# Patient Record
Sex: Female | Born: 1990 | Race: White | Hispanic: No | Marital: Married | Smoking: Former smoker
Health system: Southern US, Community
[De-identification: ages and names within clinical notes are randomized; demographics above are authoritative.]

## PROBLEM LIST (undated history)

## (undated) DIAGNOSIS — N390 Urinary tract infection, site not specified: Secondary | ICD-10-CM

## (undated) DIAGNOSIS — R519 Headache, unspecified: Secondary | ICD-10-CM

## (undated) DIAGNOSIS — IMO0002 Reserved for concepts with insufficient information to code with codable children: Secondary | ICD-10-CM

## (undated) DIAGNOSIS — J45909 Unspecified asthma, uncomplicated: Secondary | ICD-10-CM

## (undated) DIAGNOSIS — F909 Attention-deficit hyperactivity disorder, unspecified type: Secondary | ICD-10-CM

## (undated) DIAGNOSIS — D649 Anemia, unspecified: Secondary | ICD-10-CM

## (undated) DIAGNOSIS — R87619 Unspecified abnormal cytological findings in specimens from cervix uteri: Secondary | ICD-10-CM

## (undated) DIAGNOSIS — J189 Pneumonia, unspecified organism: Secondary | ICD-10-CM

## (undated) DIAGNOSIS — K649 Unspecified hemorrhoids: Secondary | ICD-10-CM

## (undated) DIAGNOSIS — G8929 Other chronic pain: Secondary | ICD-10-CM

## (undated) DIAGNOSIS — R51 Headache: Secondary | ICD-10-CM

## (undated) DIAGNOSIS — Z1509 Genetic susceptibility to other malignant neoplasm: Secondary | ICD-10-CM

## (undated) HISTORY — DX: Genetic susceptibility to other malignant neoplasm: Z15.09

## (undated) HISTORY — DX: Urinary tract infection, site not specified: N39.0

## (undated) HISTORY — PX: OTHER SURGICAL HISTORY: SHX169

## (undated) HISTORY — DX: Unspecified hemorrhoids: K64.9

## (undated) HISTORY — DX: Other chronic pain: G89.29

## (undated) HISTORY — PX: HERNIA REPAIR: SHX51

## (undated) HISTORY — PX: TONSILLECTOMY: SUR1361

## (undated) HISTORY — DX: Headache: R51

## (undated) HISTORY — DX: Unspecified asthma, uncomplicated: J45.909

## (undated) HISTORY — DX: Reserved for concepts with insufficient information to code with codable children: IMO0002

## (undated) HISTORY — DX: Unspecified abnormal cytological findings in specimens from cervix uteri: R87.619

## (undated) HISTORY — DX: Pneumonia, unspecified organism: J18.9

## (undated) HISTORY — DX: Anemia, unspecified: D64.9

## (undated) HISTORY — DX: Headache, unspecified: R51.9

---
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
## 2012-03-12 ENCOUNTER — Other Ambulatory Visit: Payer: Self-pay

## 2012-12-01 ENCOUNTER — Other Ambulatory Visit: Payer: Self-pay

## 2012-12-08 ENCOUNTER — Other Ambulatory Visit: Payer: 59

## 2012-12-08 DIAGNOSIS — N912 Amenorrhea, unspecified: Secondary | ICD-10-CM

## 2012-12-09 LAB — HCG, QUANTITATIVE, PREGNANCY: hCG, Beta Chain, Quant, S: <2 m[IU]/mL

## 2013-01-03 ENCOUNTER — Ambulatory Visit (INDEPENDENT_AMBULATORY_CARE_PROVIDER_SITE_OTHER): Payer: 59 | Admitting: Emergency Medicine

## 2013-01-03 ENCOUNTER — Encounter: Payer: Self-pay | Admitting: Emergency Medicine

## 2013-01-03 VITALS — BP 104/62 | HR 70 | Temp 97.8°F | Resp 18 | Ht 64.5 in | Wt 116.0 lb

## 2013-01-03 DIAGNOSIS — R21 Rash and other nonspecific skin eruption: Secondary | ICD-10-CM

## 2013-01-03 DIAGNOSIS — N912 Amenorrhea, unspecified: Secondary | ICD-10-CM

## 2013-01-03 DIAGNOSIS — F4323 Adjustment disorder with mixed anxiety and depressed mood: Secondary | ICD-10-CM

## 2013-01-03 DIAGNOSIS — Z23 Encounter for immunization: Secondary | ICD-10-CM

## 2013-01-03 DIAGNOSIS — F411 Generalized anxiety disorder: Secondary | ICD-10-CM

## 2013-01-03 MED ORDER — SERTRALINE HCL 100 MG PO TABS: 100.0000 mg | ORAL_TABLET | Freq: Every day | ORAL | Status: DC

## 2013-01-03 MED ORDER — PREDNISONE 10 MG PO TABS: ORAL_TABLET | ORAL | Status: DC

## 2013-01-03 MED ORDER — DEXAMETHASONE SODIUM PHOSPHATE 100 MG/10ML IJ SOLN
10.0000 mg | Freq: Once | INTRAMUSCULAR | Status: AC
  Administered 2013-01-03: 10 mg via INTRAMUSCULAR

## 2013-01-03 NOTE — Patient Instructions (Signed)
Rash  A rash is a change in the color or feel of your skin. There are many different types of rashes. You may have other problems along with your rash.  HOME CARE  · Avoid the thing that caused your rash.  · Do not scratch your rash.  · You may take cools baths to help stop itching.  · Only take medicines as told by your doctor.  · Keep all doctor visits as told.  GET HELP RIGHT AWAY IF:   · Your pain, puffiness (swelling), or redness gets worse.  · You have a fever.  · You have new or severe problems.  · You have body aches, watery poop (diarrhea), or you throw up (vomit).  · Your rash is not better after 3 days.  MAKE SURE YOU:   · Understand these instructions.  · Will watch your condition.  · Will get help right away if you are not doing well or get worse.  Document Released: 07/01/2007 Document Revised: 04/06/2011 Document Reviewed: 10/27/2010  [employer]® Patient Information ©2014 [employer].

## 2013-01-04 DIAGNOSIS — F4323 Adjustment disorder with mixed anxiety and depressed mood: Secondary | ICD-10-CM | POA: Insufficient documentation

## 2013-01-04 NOTE — Progress Notes (Signed)
   Subjective:    Patient ID: Melissa Cantu, female    DOB: 05/09/1990, 22 y.o.   MRN: [id number]  HPI Comments: 22 yo without known new exposure or bug bites presents with increasing itchy rash. No relief with OTC topicals and Xyzal. She notes her home is not very clean and plans on relocating. She is also having increased stress and counselor advised increasing Zoloft dose. She is not eating as healthy or taking good care of herself and notes mild bruising were she has been scratching rash.  Rash    Current Outpatient Prescriptions on File Prior to Visit  Medication Sig Dispense Refill  . flintstones complete (FLINTSTONES) 60 MG chewable tablet Chew 1 tablet by mouth daily.      Marland Kitchen levocetirizine (XYZAL) 5 MG tablet Take 5 mg by mouth every evening.      . lisdexamfetamine (VYVANSE) 40 MG capsule Take 40 mg by mouth daily.       No current facility-administered medications on file prior to visit.  ZOLOFT 75 mg  Review of patient's allergies indicates no known allergies.  Past Medical History  Diagnosis Date  . ADHD (attention deficit hyperactivity disorder)   . 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   . Asthma   . Anemia      Review of Systems  Skin: Positive for rash.  Psychiatric/Behavioral: The patient is nervous/anxious.     BP 104/62  Pulse 70  Temp(Src) 97.8 F (36.6 C) (Temporal)  Resp 18  Ht 5' 4.5" (1.638 m)  Wt 116 lb (52.617 kg)  BMI 19.61 kg/m2  LMP 12/04/2012     Objective:   Physical Exam  Nursing note and vitals reviewed. Constitutional: She is oriented to person, place, and time. She appears well-developed and well-nourished. No distress.  HENT:  Head: Normocephalic and atraumatic.  Right Ear: External ear normal.  Left Ear: External ear normal.  Nose: Nose normal.  Mouth/Throat: Oropharynx is clear and moist.  Eyes: Conjunctivae and EOM are normal.  Neck: Normal range of motion. Neck  supple. No JVD present. No thyromegaly present.  Cardiovascular: Normal rate, regular rhythm, normal heart sounds and intact distal pulses.   Pulmonary/Chest: Effort normal and breath sounds normal.  Abdominal: Soft. Bowel sounds are normal. She exhibits no distension and no mass. There is no tenderness. There is no rebound and no guarding.  Musculoskeletal: Normal range of motion. She exhibits no edema and no tenderness.  Lymphadenopathy:    She has no cervical adenopathy.  Neurological: She is alert and oriented to person, place, and time. No cranial nerve deficit.  Skin: Skin is warm and dry. Rash noted. There is erythema. No pallor.  Scattered patches with mild elevation from 3-10 mm in size worse on upper extremities, few on back abdomen  Psychiatric: She has a normal mood and affect. Her behavior is normal. Judgment and thought content normal.      Urine preg NEG    Assessment & Plan:  1. Rash ? allergic reaction vs bug bites- Dexamethasone 1, if no relief Pred 10 mg DP AD. Clean house, hygiene explained, increase H20 may add benadryl. 2. Stress/ Anxiety increase- Increase Zoloft to 100mg , decrease stress 3. ? Bruising from trauma will monitor and call if no change for lab eval

## 2013-02-27 ENCOUNTER — Other Ambulatory Visit: Payer: Self-pay | Admitting: *Deleted

## 2013-02-27 DIAGNOSIS — F411 Generalized anxiety disorder: Secondary | ICD-10-CM

## 2013-02-27 MED ORDER — SERTRALINE HCL 100 MG PO TABS: 100.0000 mg | ORAL_TABLET | Freq: Every day | ORAL | Status: DC

## 2013-03-06 ENCOUNTER — Other Ambulatory Visit: Payer: Self-pay | Admitting: Emergency Medicine

## 2013-03-06 MED ORDER — LISDEXAMFETAMINE DIMESYLATE 40 MG PO CAPS: 40.0000 mg | ORAL_CAPSULE | Freq: Every day | ORAL | Status: DC

## 2013-04-13 ENCOUNTER — Encounter: Payer: Self-pay | Admitting: Emergency Medicine

## 2013-04-13 ENCOUNTER — Ambulatory Visit (INDEPENDENT_AMBULATORY_CARE_PROVIDER_SITE_OTHER): Payer: 59 | Admitting: Emergency Medicine

## 2013-04-13 VITALS — BP 98/62 | HR 72 | Temp 98.4°F | Resp 18 | Ht 64.5 in | Wt 117.0 lb

## 2013-04-13 DIAGNOSIS — N912 Amenorrhea, unspecified: Secondary | ICD-10-CM

## 2013-04-13 DIAGNOSIS — I10 Essential (primary) hypertension: Secondary | ICD-10-CM

## 2013-04-13 DIAGNOSIS — N926 Irregular menstruation, unspecified: Secondary | ICD-10-CM

## 2013-04-13 LAB — BASIC METABOLIC PANEL WITH GFR
BUN: 8 mg/dL (ref 6–23)
CALCIUM: 9.4 mg/dL (ref 8.4–10.5)
CHLORIDE: 109 meq/L (ref 96–112)
CO2: 26 meq/L (ref 19–32)
Creat: 0.64 mg/dL (ref 0.50–1.10)
GFR, Est African American: >89 mL/min
Glucose, Bld: 82 mg/dL (ref 70–99)
Potassium: 4 mEq/L (ref 3.5–5.3)
SODIUM: 141 meq/L (ref 135–145)

## 2013-04-13 LAB — POCT URINE PREGNANCY: PREG TEST UR: NEGATIVE

## 2013-04-13 LAB — CBC WITH DIFFERENTIAL/PLATELET
Basophils Absolute: 0.1 10*3/uL (ref 0.0–0.1)
Basophils Relative: 1 % (ref 0–1)
EOS PCT: 2 % (ref 0–5)
Eosinophils Absolute: 0.2 10*3/uL (ref 0.0–0.7)
HCT: 37.2 % (ref 36.0–46.0)
Hemoglobin: 12.5 g/dL (ref 12.0–15.0)
LYMPHS ABS: 2.4 10*3/uL (ref 0.7–4.0)
LYMPHS PCT: 28 % (ref 12–46)
MCH: 29.4 pg (ref 26.0–34.0)
MCHC: 33.6 g/dL (ref 30.0–36.0)
MCV: 87.5 fL (ref 78.0–100.0)
Monocytes Absolute: 0.9 10*3/uL (ref 0.1–1.0)
Monocytes Relative: 11 % (ref 3–12)
NEUTROS ABS: 4.9 10*3/uL (ref 1.7–7.7)
Neutrophils Relative %: 58 % (ref 43–77)
PLATELETS: 267 10*3/uL (ref 150–400)
RBC: 4.25 MIL/uL (ref 3.87–5.11)
RDW: 13.9 % (ref 11.5–15.5)
WBC: 8.5 10*3/uL (ref 4.0–10.5)

## 2013-04-13 LAB — TSH: TSH: 1.553 u[IU]/mL (ref 0.350–4.500)

## 2013-04-13 NOTE — Patient Instructions (Signed)
Ovarian Cyst An ovarian cyst is a sac filled with fluid or blood. This sac is attached to the ovary. Some cysts go away on their own. Other cysts need treatment.  HOME CARE   Only take medicine as told by your doctor.  Follow up with your doctor as told.  Get regular pelvic exams and Pap tests. GET HELP IF:  Your periods are late, not regular, or painful.  You stop having periods.  Your belly (abdominal) or pelvic pain does not go away.  Your belly becomes large or puffy (swollen).  You have a hard time peeing (totally emptying your bladder).  You have pressure on your bladder.  You have pain during sex.  You feel fullness, pressure, or discomfort in your belly.  You lose weight for no reason.  You feel sick most of the time.  You have a hard time pooping (constipation).  You do not feel like eating.  You develop pimples (acne).  You have an increase in hair on your body and face.  You are gaining weight for no reason.  You think you are pregnant. GET HELP RIGHT AWAY IF:   Your belly pain gets worse.  You feel sick to your stomach (nauseous), and you throw up (vomit).  You have a fever that comes on fast.  You have belly pain while pooping (bowel movement).  Your periods are heavier than usual. MAKE SURE YOU:   Understand these instructions.  Will watch your condition.  Will get help right away if you are not doing well or get worse. Document Released: 07/01/2007 Document Revised: 11/02/2012 Document Reviewed: 09/19/2012 [facility] Patient Information 2014 [place]. Ectopic Pregnancy An ectopic pregnancy happens when a fertilized egg grows outside the uterus. A pregnancy cannot live outside of the uterus. This problem often happens in the fallopian tube. It is often caused by damage to the fallopian tube. If this problem is found early, you may be treated with medicine. If your tube tears or bursts open (ruptures), you will bleed inside. This is an  emergency. You will need surgery. Get help right away.  SYMPTOMS You may have normal pregnancy symptoms at first. These include:  Missing your period.  Feeling sick to your stomach (nauseous).  Being tired.  Having tender breasts. Then, you may start to have symptoms that are not normal. These include:  Pain with sex (intercourse).  Bleeding from the vagina. This includes light bleeding (spotting).  Belly (abdomen) or lower belly cramping or pain. This may be felt on one side.  A fast heartbeat (pulse).  Passing out (fainting) after going poop (bowel movement). If your tube tears, you may have symptoms such as:  Really bad pain in the belly or lower belly. This happens suddenly.  Dizziness.  Passing out.  Shoulder pain. GET HELP RIGHT AWAY IF:  You have any of these symptoms. This is an emergency. Document Released: 04/10/2008 Document Revised: 11/02/2012 Document Reviewed: 08/24/2012 [facility] Patient Information 2014 [place], [place].

## 2013-04-13 NOTE — Progress Notes (Signed)
   Subjective:    Patient ID: Melissa Cantu, female    DOB: 1990-02-23, 23 y.o.   MRN: [id number]  HPI Comments: 23 yo female with hx of erratic menses.She has been doing better off the pill x several pill for last several months. She started cycle 4 days early and heavy/ clotting/ painful across low abdomen, but mildly worse on the left. She is feeling more fatigued over the last week or so. She has had unprotected sex and is unsure if she is pregnant.     Medication List       This list is accurate as of: 04/13/13 11:59 PM.  Always use your most recent med list.               flintstones complete 60 MG chewable tablet  Chew 1 tablet by mouth daily.     levocetirizine 5 MG tablet  Commonly known as:  XYZAL  Take 5 mg by mouth every evening.     lisdexamfetamine 40 MG capsule  Commonly known as:  VYVANSE  Take 1 capsule (40 mg total) by mouth daily.     sertraline 100 MG tablet  Commonly known as:  ZOLOFT  Take 1 tablet (100 mg total) by mouth daily.       No Known Allergies Past Medical History  Diagnosis Date  . ADHD (attention deficit hyperactivity disorder)   . 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   . Asthma   . Anemia      Review of Systems  Constitutional: Positive for fatigue.  Genitourinary: Positive for menstrual problem and pelvic pain.  All other systems reviewed and are negative.   BP 98/62  Pulse 72  Temp(Src) 98.4 F (36.9 C) (Temporal)  Resp 18  Ht 5' 4.5" (1.638 m)  Wt 117 lb (53.071 kg)  BMI 19.78 kg/m2  LMP 04/13/2013     Objective:   Physical Exam  Nursing note and vitals reviewed. Constitutional: She is oriented to person, place, and time. She appears well-developed and well-nourished.  HENT:  Head: Normocephalic and atraumatic.  Eyes: Conjunctivae are normal.  Neck: Normal range of motion.  Cardiovascular: Normal rate, regular rhythm, normal heart sounds and intact  distal pulses.   Pulmonary/Chest: Effort normal and breath sounds normal.  Abdominal: Soft. Bowel sounds are normal. She exhibits no distension and no mass. There is no tenderness. There is no rebound and no guarding.  Genitourinary:  DEF to GYN  Musculoskeletal: Normal range of motion.  Neurological: She is alert and oriented to person, place, and time.  Skin: Skin is warm and dry.  Psychiatric: She has a normal mood and affect. Judgment normal.      Urine Preg NEG    Assessment & Plan:  Irreg menses- Check labs, U/s Pelvic if labs negative, If negative needs GYN, w/c if SX increase or ER.

## 2013-04-14 ENCOUNTER — Ambulatory Visit
Admission: RE | Admit: 2013-04-14 | Discharge: 2013-04-14 | Disposition: A | Discharge status: Home / Self Care | Payer: 59 | Source: Ambulatory Visit | Attending: Emergency Medicine | Admitting: Emergency Medicine

## 2013-04-14 DIAGNOSIS — N926 Irregular menstruation, unspecified: Secondary | ICD-10-CM

## 2013-04-14 LAB — HCG, QUANTITATIVE, PREGNANCY

## 2013-04-21 ENCOUNTER — Ambulatory Visit (HOSPITAL_COMMUNITY): Payer: 59

## 2013-06-09 ENCOUNTER — Telehealth: Payer: Self-pay

## 2013-06-09 NOTE — Telephone Encounter (Signed)
Pt states she missed menstrual cycle last month, but did not take a pregnancy test. Started cycle yesterday.  this morning she is having severe cramping w/ clear discharge and "can feel her hips widening." Pt is concerned she may be having a miscarriage?  Per Vicie Mutters pt is advised to go to ER for evaluation. Pt aware.

## 2013-07-20 ENCOUNTER — Encounter: Payer: Self-pay | Admitting: Physician Assistant

## 2013-07-20 ENCOUNTER — Ambulatory Visit (INDEPENDENT_AMBULATORY_CARE_PROVIDER_SITE_OTHER): Payer: 59 | Admitting: Physician Assistant

## 2013-07-20 ENCOUNTER — Other Ambulatory Visit: Payer: Self-pay | Admitting: Emergency Medicine

## 2013-07-20 VITALS — BP 100/60 | HR 72 | Temp 98.6°F | Resp 16 | Ht 64.5 in | Wt 112.0 lb

## 2013-07-20 DIAGNOSIS — F411 Generalized anxiety disorder: Secondary | ICD-10-CM

## 2013-07-20 DIAGNOSIS — K2100 Gastro-esophageal reflux disease with esophagitis, without bleeding: Secondary | ICD-10-CM

## 2013-07-20 DIAGNOSIS — R079 Chest pain, unspecified: Secondary | ICD-10-CM

## 2013-07-20 DIAGNOSIS — F17200 Nicotine dependence, unspecified, uncomplicated: Secondary | ICD-10-CM

## 2013-07-20 MED ORDER — SERTRALINE HCL 100 MG PO TABS: 100.0000 mg | ORAL_TABLET | Freq: Every day | ORAL | Status: DC

## 2013-07-20 MED ORDER — PANTOPRAZOLE SODIUM 40 MG PO TBEC: 40.0000 mg | DELAYED_RELEASE_TABLET | Freq: Every day | ORAL | Status: DC

## 2013-07-20 NOTE — Patient Instructions (Signed)
Smoking Cessation Quitting smoking is important to your health and has many advantages. However, it is not always easy to quit since nicotine is a very addictive drug. Often times, people try 3 times or more before being able to quit. This document explains the best ways for you to prepare to quit smoking. Quitting takes hard work and a lot of effort, but you can do it. ADVANTAGES OF QUITTING SMOKING  You will live longer, feel better, and live better.  Your body will feel the impact of quitting smoking almost immediately.  Within 20 minutes, blood pressure decreases. Your pulse returns to its normal level.  After 8 hours, carbon monoxide levels in the blood return to normal. Your oxygen level increases.  After 24 hours, the chance of having a heart attack starts to decrease. Your breath, hair, and body stop smelling like smoke.  After 48 hours, damaged nerve endings begin to recover. Your sense of taste and smell improve.  After 72 hours, the body is virtually free of nicotine. Your bronchial tubes relax and breathing becomes easier.  After 2 to 12 weeks, lungs can hold more air. Exercise becomes easier and circulation improves.  The risk of having a heart attack, stroke, cancer, or lung disease is greatly reduced.  After 1 year, the risk of coronary heart disease is cut in half.  After 5 years, the risk of stroke falls to the same as a nonsmoker.  After 10 years, the risk of lung cancer is cut in half and the risk of other cancers decreases significantly.  After 15 years, the risk of coronary heart disease drops, usually to the level of a nonsmoker.  If you are pregnant, quitting smoking will improve your chances of having a healthy baby.  The people you live with, especially any children, will be healthier.  You will have extra money to spend on things other than cigarettes. QUESTIONS TO THINK ABOUT BEFORE ATTEMPTING TO QUIT You may want to talk about your answers with your  caregiver.  Why do you want to quit?  If you tried to quit in the past, what helped and what did not?  What will be the most difficult situations for you after you quit? How will you plan to handle them?  Who can help you through the tough times? Your family? Friends? A caregiver?  What pleasures do you get from smoking? What ways can you still get pleasure if you quit? Here are some questions to ask your caregiver:  How can you help me to be successful at quitting?  What medicine do you think would be best for me and how should I take it?  What should I do if I need more help?  What is smoking withdrawal like? How can I get information on withdrawal? GET READY  Set a quit date.  Change your environment by getting rid of all cigarettes, ashtrays, matches, and lighters in your home, car, or work. Do not let people smoke in your home.  Review your past attempts to quit. Think about what worked and what did not. GET SUPPORT AND ENCOURAGEMENT You have a better chance of being successful if you have help. You can get support in many ways.  Tell your family, friends, and co-workers that you are going to quit and need their support. Ask them not to smoke around you.  Get individual, group, or telephone counseling and support. Programs are available at local hospitals and health centers. Call your local health department for   information about programs in your area.  Spiritual beliefs and practices may help some smokers quit.  Download a "quit meter" on your computer to keep track of quit statistics, such as how long you have gone without smoking, cigarettes not smoked, and money saved.  Get a self-help book about quitting smoking and staying off of tobacco. [place] yourself from urges to smoke. Talk to someone, go for a walk, or occupy your time with a task.  Change your normal routine. Take a different route to work. Drink tea instead of coffee.  Eat breakfast in a different place.  Reduce your stress. Take a hot bath, exercise, or read a book.  Plan something enjoyable to do every day. Reward yourself for not smoking.  Explore interactive web-based programs that specialize in helping you quit. GET MEDICINE AND USE IT CORRECTLY Medicines can help you stop smoking and decrease the urge to smoke. Combining medicine with the above behavioral methods and support can greatly increase your chances of successfully quitting smoking.  Nicotine replacement therapy helps deliver nicotine to your body without the negative effects and risks of smoking. Nicotine replacement therapy includes nicotine gum, lozenges, inhalers, nasal sprays, and skin patches. Some may be available over-the-counter and others require a prescription.  Antidepressant medicine helps people abstain from smoking, but how this works is unknown. This medicine is available by prescription.  Nicotinic receptor partial agonist medicine simulates the effect of nicotine in your brain. This medicine is available by prescription. Ask your caregiver for advice about which medicines to use and how to use them based on your health history. Your caregiver will tell you what side effects to look out for if you choose to be on a medicine or therapy. Carefully read the information on the package. Do not use any other product containing nicotine while using a nicotine replacement product.  RELAPSE OR DIFFICULT SITUATIONS Most relapses occur within the first 3 months after quitting. Do not be discouraged if you start smoking again. Remember, most people try several times before finally quitting. You may have symptoms of withdrawal because your body is used to nicotine. You may crave cigarettes, be irritable, feel very hungry, cough often, get headaches, or have difficulty concentrating. The withdrawal symptoms are only temporary. They are strongest when you first quit, but they will go away within  10-14 days. To reduce the chances of relapse, try to:  Avoid drinking alcohol. Drinking lowers your chances of successfully quitting.  Reduce the amount of caffeine you consume. Once you quit smoking, the amount of caffeine in your body increases and can give you symptoms, such as a rapid heartbeat, sweating, and anxiety.  Avoid smokers because they can make you want to smoke.  Do not let weight gain distract you. Many smokers will gain weight when they quit, usually less than 10 pounds. Eat a healthy diet and stay active. You can always lose the weight gained after you quit.  Find ways to improve your mood other than smoking. FOR MORE INFORMATION  www.smokefree.gov  Document Released: 01/06/2001 Document Revised: 07/14/2011 Document Reviewed: 04/23/2011 [facility] Patient Information 2015 [place], [place]. This information is not intended to replace advice given to you by your health care provider. Make sure you discuss any questions you have with your health care provider.  Food Choices for Gastroesophageal Reflux Disease When you have gastroesophageal reflux disease (GERD), the foods you eat and your eating habits are very important. Choosing the right foods can  help ease the discomfort of GERD. WHAT GENERAL GUIDELINES DO I NEED TO FOLLOW?  Choose fruits, vegetables, whole grains, low-fat dairy products, and low-fat meat, fish, and poultry.  Limit fats such as oils, salad dressings, butter, nuts, and avocado.  Keep a food diary to identify foods that cause symptoms.  Avoid foods that cause reflux. These may be different for different people.  Eat frequent small meals instead of three large meals each day.  Eat your meals slowly, in a relaxed setting.  Limit fried foods.  Cook foods using methods other than frying.  Avoid drinking alcohol.  Avoid drinking large amounts of liquids with your meals.  Avoid bending over or lying down until 2-3 hours after eating. WHAT FOODS ARE  NOT RECOMMENDED? The following are some foods and drinks that may worsen your symptoms: Vegetables Tomatoes. Tomato juice. Tomato and spaghetti sauce. Chili peppers. Onion and garlic. Horseradish. Fruits Oranges, grapefruit, and lemon (fruit and juice). Meats High-fat meats, fish, and poultry. This includes hot dogs, ribs, ham, sausage, salami, and bacon. Dairy Whole milk and chocolate milk. Sour cream. Cream. Butter. Ice cream. Cream cheese.  Beverages Coffee and tea, with or without caffeine. Carbonated beverages or energy drinks. Condiments Hot sauce. Barbecue sauce.  Sweets/Desserts Chocolate and cocoa. Donuts. Peppermint and spearmint. Fats and Oils High-fat foods, including [place] fries and potato chips. Other Vinegar. Strong spices, such as black pepper, white pepper, red pepper, cayenne, curry powder, cloves, ginger, and chili powder. The items listed above may not be a complete list of foods and beverages to avoid. Contact your dietitian for more information. Document Released: 01/12/2005 Document Revised: 01/17/2013 Document Reviewed: 11/16/2012 [facility] Patient Information 2015 [place], [place]. This information is not intended to replace advice given to you by your health care provider. Make sure you discuss any questions you have with your health care provider.

## 2013-07-20 NOTE — Progress Notes (Signed)
   Subjective:    Patient ID: Melissa Cantu, female    DOB: 1990/08/31, 23 y.o.   MRN: [id number]  HPI 23 y.o. smoking female with 2 day history of chest pain. States it is intermittent nonexertional chest burning and tightness, feels she can not get a deep breath, has some bad taste in her throat, some left very well localized chest pain, and lower back pain. Denies palpitations, diarrhea, nausea, constipations, dizziness.    Review of Systems  Constitutional: Negative.   HENT: Negative.   Respiratory: Positive for chest tightness and shortness of breath. Negative for cough, choking and wheezing.   Cardiovascular: Positive for chest pain. Negative for palpitations and leg swelling.  Gastrointestinal: Negative.   Genitourinary: Negative.   Musculoskeletal: Negative.   Neurological: Negative.        Objective:   Physical Exam  Constitutional: She is oriented to person, place, and time. She appears well-developed and well-nourished.  HENT:  Head: Normocephalic and atraumatic.  Right Ear: External ear normal.  Left Ear: External ear normal.  Mouth/Throat: Oropharynx is clear and moist.  Eyes: Conjunctivae and EOM are normal. Pupils are equal, round, and reactive to light.  Neck: Normal range of motion. Neck supple. No thyromegaly present.  Cardiovascular: Normal rate, regular rhythm and normal heart sounds.  Exam reveals no gallop and no friction rub.   No murmur heard. Pulmonary/Chest: Effort normal and breath sounds normal. No respiratory distress. She has no wheezes.  Abdominal: Soft. Bowel sounds are normal. She exhibits no shifting dullness, no distension, no abdominal bruit, no pulsatile midline mass and no mass. There is no hepatosplenomegaly. There is tenderness in the epigastric area and left lower quadrant. There is guarding. There is no rigidity, no rebound, no CVA tenderness, no tenderness at McBurney's point and negative Murphy's sign. No hernia.  Musculoskeletal: Normal  range of motion.  Lymphadenopathy:    She has no cervical adenopathy.  Neurological: She is alert and oriented to person, place, and time.  Skin: Skin is warm and dry.  Psychiatric: She has a normal mood and affect.      Assessment & Plan:  Epigastric.atypical chest pain versus pleuritis from smoking-   bland diet, small portions, increase H20, PPI Smoking cessation discussed if pain continues will return for CXR and labs.  Patient declines labs/Xray due to cost and would prefer conservative treatment first.   Patient advised to go to the ER if the symptoms increase or worsen.

## 2013-09-13 ENCOUNTER — Encounter: Payer: Self-pay | Admitting: Physician Assistant

## 2013-09-13 ENCOUNTER — Ambulatory Visit (INDEPENDENT_AMBULATORY_CARE_PROVIDER_SITE_OTHER): Payer: 59 | Admitting: Physician Assistant

## 2013-09-13 VITALS — BP 100/70 | HR 76 | Temp 98.1°F | Resp 16 | Wt 111.0 lb

## 2013-09-13 DIAGNOSIS — N760 Acute vaginitis: Secondary | ICD-10-CM

## 2013-09-13 DIAGNOSIS — R300 Dysuria: Secondary | ICD-10-CM

## 2013-09-13 DIAGNOSIS — N912 Amenorrhea, unspecified: Secondary | ICD-10-CM

## 2013-09-13 DIAGNOSIS — Z113 Encounter for screening for infections with a predominantly sexual mode of transmission: Secondary | ICD-10-CM

## 2013-09-13 MED ORDER — LISDEXAMFETAMINE DIMESYLATE 40 MG PO CAPS: 40.0000 mg | ORAL_CAPSULE | Freq: Every day | ORAL | Status: DC

## 2013-09-13 MED ORDER — METRONIDAZOLE 500 MG PO TABS: ORAL_TABLET | ORAL | Status: DC

## 2013-09-13 NOTE — Progress Notes (Signed)
Subjective:     Melissa Cantu is a 23 y.o. female who presents for evaluation of an abnormal vaginal discharge, she treated with OTC cream 2 weeks ago but it started again 2 days ago. Symptoms have been present for 2 days. Vaginal symptoms: abnormal bleeding: early menses, heavy bleeding for 1 week, discharge described as white, malodorous and yellow, dyspareunia, odor, urinary symptoms of chills, dysuria and nausea and vulvar itching. Contraception: none. Has unprotected sex with boyfriend. She denies blisters, bumps, lesions and warts Sexually transmitted infection risk: possible STD exposure. Menstrual flow: was early this month and lasted a week, she has a history of irregular menses.  The following portions of the patient's history were reviewed and updated as appropriate: allergies, current medications, past family history, past medical history, past social history, past surgical history and problem list.   Review of Systems Pertinent items are noted in HPI.    Objective:    BP 100/70  Pulse 76  Temp(Src) 98.1 F (36.7 C)  Resp 16  Wt 111 lb (50.349 kg) General appearance: alert Abdomen: normal findings: bowel sounds normal and no masses palpable and abnormal findings:  mild tenderness in the epigastrium, in the lower abdomen and without rebound tenderness Pelvic: positive findings: positive whiff test, vaginal discharge:  copious, yellow and malodorous or with straberry cervix    Assessment:    Bacterial vaginosis, Chlamydia, Gonorrhea, Trichomonas vaginalis and at risk for STDs/pregnancy.    Plan:    Educational materials distributed. Oral antifungal see orders. Abstinence from intercourse discussed. Partner notification discussed. Discussed safe sex.

## 2013-09-13 NOTE — Patient Instructions (Signed)

## 2013-09-14 LAB — URINALYSIS, MICROSCOPIC ONLY
CRYSTALS: NONE SEEN
Casts: NONE SEEN

## 2013-09-14 LAB — FLUORESCENT TREPONEMAL AB(FTA)-IGG-BLD: Fluorescent Treponemal ABS: NONREACTIVE

## 2013-09-14 LAB — URINALYSIS, ROUTINE W REFLEX MICROSCOPIC
BILIRUBIN URINE: NEGATIVE
GLUCOSE, UA: NEGATIVE mg/dL
HGB URINE DIPSTICK: NEGATIVE
KETONES UR: NEGATIVE mg/dL
Nitrite: POSITIVE — AB
PH: 5.5 (ref 5.0–8.0)
Protein, ur: NEGATIVE mg/dL
SPECIFIC GRAVITY, URINE: 1.028 (ref 1.005–1.030)
Urobilinogen, UA: 0.2 mg/dL (ref 0.0–1.0)

## 2013-09-14 LAB — WET PREP BY MOLECULAR PROBE
CANDIDA SPECIES: POSITIVE — AB
Gardnerella vaginalis: POSITIVE — AB
TRICHOMONAS VAG: NEGATIVE

## 2013-09-14 LAB — RPR TITER: RPR Titer: 1:1 {titer}

## 2013-09-14 LAB — RPR: RPR Ser Ql: REACTIVE — AB

## 2013-09-14 LAB — GC/CHLAMYDIA PROBE AMP, URINE
Chlamydia, Swab/Urine, PCR: NEGATIVE
GC PROBE AMP, URINE: NEGATIVE

## 2013-09-14 LAB — HIV ANTIBODY (ROUTINE TESTING W REFLEX): HIV 1&2 Ab, 4th Generation: NONREACTIVE

## 2013-09-14 LAB — HSV(HERPES SIMPLEX VRS) I + II AB-IGG: HSV 1 GLYCOPROTEIN G AB, IGG: 3.67 IV — AB

## 2013-09-14 MED ORDER — FLUCONAZOLE 150 MG PO TABS: 150.0000 mg | ORAL_TABLET | Freq: Every day | ORAL | Status: DC

## 2013-09-14 NOTE — Addendum Note (Signed)
Addended by: Vicie Mutters R on: 09/14/2013 08:25 AM   Modules accepted: Orders

## 2013-09-16 LAB — URINE CULTURE

## 2013-09-16 MED ORDER — NITROFURANTOIN MONOHYD MACRO 100 MG PO CAPS: 100.0000 mg | ORAL_CAPSULE | Freq: Two times a day (BID) | ORAL | Status: AC

## 2013-09-16 NOTE — Addendum Note (Signed)
Addended by: Vicie Mutters R on: 09/16/2013 12:17 PM   Modules accepted: Orders

## 2013-10-09 ENCOUNTER — Other Ambulatory Visit: Payer: Self-pay | Admitting: Emergency Medicine

## 2013-12-04 ENCOUNTER — Other Ambulatory Visit: Payer: Self-pay | Admitting: Physician Assistant

## 2013-12-04 MED ORDER — LISDEXAMFETAMINE DIMESYLATE 40 MG PO CAPS: 40.0000 mg | ORAL_CAPSULE | Freq: Every day | ORAL | Status: DC

## 2015-10-03 ENCOUNTER — Encounter: Payer: Self-pay | Admitting: Physician Assistant

## 2015-10-03 ENCOUNTER — Ambulatory Visit (INDEPENDENT_AMBULATORY_CARE_PROVIDER_SITE_OTHER): Payer: 59 | Admitting: Physician Assistant

## 2015-10-03 VITALS — BP 106/60 | HR 63 | Temp 97.3°F | Resp 16 | Ht 64.5 in | Wt 123.2 lb

## 2015-10-03 DIAGNOSIS — N3000 Acute cystitis without hematuria: Secondary | ICD-10-CM | POA: Diagnosis not present

## 2015-10-03 DIAGNOSIS — R1084 Generalized abdominal pain: Secondary | ICD-10-CM | POA: Diagnosis not present

## 2015-10-03 DIAGNOSIS — M545 Low back pain, unspecified: Secondary | ICD-10-CM

## 2015-10-03 LAB — COMPREHENSIVE METABOLIC PANEL
ALK PHOS: 45 U/L (ref 33–115)
ALT: 17 U/L (ref 6–29)
AST: 14 U/L (ref 10–30)
Albumin: 4.5 g/dL (ref 3.6–5.1)
BILIRUBIN TOTAL: 0.6 mg/dL (ref 0.2–1.2)
BUN: 13 mg/dL (ref 7–25)
CALCIUM: 9.6 mg/dL (ref 8.6–10.2)
CO2: 21 mmol/L (ref 20–31)
CREATININE: 0.67 mg/dL (ref 0.50–1.10)
Chloride: 110 mmol/L (ref 98–110)
GLUCOSE: 80 mg/dL (ref 65–99)
Potassium: 3.7 mmol/L (ref 3.5–5.3)
Sodium: 138 mmol/L (ref 135–146)
Total Protein: 7.3 g/dL (ref 6.1–8.1)

## 2015-10-03 LAB — CBC WITH DIFFERENTIAL/PLATELET
BASOS PCT: 1 %
Basophils Absolute: 86 cells/uL (ref 0–200)
Eosinophils Absolute: 258 cells/uL (ref 15–500)
Eosinophils Relative: 3 %
HEMATOCRIT: 38.4 % (ref 35.0–45.0)
HEMOGLOBIN: 12.8 g/dL (ref 11.7–15.5)
LYMPHS ABS: 3354 {cells}/uL (ref 850–3900)
LYMPHS PCT: 39 %
MCH: 29.2 pg (ref 27.0–33.0)
MCHC: 33.3 g/dL (ref 32.0–36.0)
MCV: 87.5 fL (ref 80.0–100.0)
MONO ABS: 430 {cells}/uL (ref 200–950)
MPV: 9.9 fL (ref 7.5–12.5)
Monocytes Relative: 5 %
Neutro Abs: 4472 cells/uL (ref 1500–7800)
Neutrophils Relative %: 52 %
Platelets: 302 10*3/uL (ref 140–400)
RBC: 4.39 MIL/uL (ref 3.80–5.10)
RDW: 13.4 % (ref 11.0–15.0)
WBC: 8.6 10*3/uL (ref 3.8–10.8)

## 2015-10-03 LAB — POCT URINE PREGNANCY: Preg Test, Ur: NEGATIVE

## 2015-10-03 MED ORDER — MELOXICAM 15 MG PO TABS: ORAL_TABLET | ORAL | 1 refills | Status: DC

## 2015-10-03 MED ORDER — CYCLOBENZAPRINE HCL 10 MG PO TABS: 10.0000 mg | ORAL_TABLET | Freq: Three times a day (TID) | ORAL | 0 refills | Status: DC | PRN

## 2015-10-03 NOTE — Patient Instructions (Signed)

## 2015-10-03 NOTE — Progress Notes (Signed)
   Subjective:    Patient ID: Melissa Cantu, female    DOB: [birth year], 25 y.o.   MRN: [id number]  HPI 25 y.o. WF presents with back pain  x 2 weeks, worse last few days.   LMP August 15th.   Bilateral lower back pain that will wrap around to her stomach, certain positions make it worse, worse with standing straight, some pain at night trying to find comfortable spot, driving long periods in the car. Has some right knee pain with pain up her leg, no weakness, numbness, tingling. Denies urgency, frequency, dysuria, no fever/chills but . She has had some diarrhea at night.   Blood pressure 106/60, pulse 63, temperature 97.3 F (36.3 C), resp. rate 16, height 5' 4.5" (1.638 m), weight 123 lb 3.2 oz (55.9 kg), last menstrual period 09/10/2015, SpO2 97 %.  Medications Current Outpatient Prescriptions on File Prior to Visit  Medication Sig  . levocetirizine (XYZAL) 5 MG tablet Take 5 mg by mouth every evening.  . lisdexamfetamine (VYVANSE) 40 MG capsule Take 1 capsule (40 mg total) by mouth daily.   No current facility-administered medications on file prior to visit.     Problem list She has LGSIL (low grade squamous intraepithelial dysplasia) and Adjustment disorder with mixed anxiety and depressed mood on her problem list.  Review of Systems  Constitutional: Negative for chills, fatigue and fever.  Gastrointestinal: Positive for abdominal pain (general). Negative for nausea and vomiting.  Genitourinary: Negative for difficulty urinating, dysuria, flank pain, frequency, hematuria and urgency.  Musculoskeletal: Positive for back pain.       Objective:   Physical Exam  Constitutional: She is oriented to person, place, and time. She appears well-developed and well-nourished.  HENT:  Head: Normocephalic and atraumatic.  Eyes: Conjunctivae are normal. Pupils are equal, round, and reactive to light.  Neck: Normal range of motion. Neck supple.  Cardiovascular: Normal rate and regular  rhythm.   Pulmonary/Chest: Effort normal and breath sounds normal.  Abdominal: Soft. Bowel sounds are normal. There is generalized tenderness. There is CVA tenderness (bilateral). There is no rigidity, no rebound, no guarding, no tenderness at McBurney's point and negative Murphy's sign.  Musculoskeletal:  Patient is able to ambulate well. Gait is  Antalgic. Straight leg raising with dorsiflexion negative bilaterally for radicular symptoms. Sensory exam in the legs are normal. Knee reflexes are normal Ankle reflexes are normal Strength is normal and symmetric in arms and legs. There is not SI tenderness to palpation.  There is paraspinal muscle spasm.  There is not midline tenderness.  ROM of spine with  limited in all spheres due to pain.   Lymphadenopathy:    She has no cervical adenopathy.  Neurological: She is alert and oriented to person, place, and time. She has normal reflexes.  Skin: Skin is warm and dry. No rash noted.       Assessment & Plan:  Lower back pain Seems more mechanical will rule out UTI/kidney/pregnancy Get UA C&S, CBC, CMET Mobic/flexeril/RICE/stretches, if not better can get Xray

## 2015-10-04 LAB — URINALYSIS, ROUTINE W REFLEX MICROSCOPIC
BILIRUBIN URINE: NEGATIVE
GLUCOSE, UA: NEGATIVE
HGB URINE DIPSTICK: NEGATIVE
LEUKOCYTES UA: NEGATIVE
Nitrite: NEGATIVE
PROTEIN: NEGATIVE
Specific Gravity, Urine: 1.029 (ref 1.001–1.035)
pH: 6 (ref 5.0–8.0)

## 2015-10-06 LAB — URINE CULTURE: Organism ID, Bacteria: 10000

## 2016-01-09 ENCOUNTER — Encounter: Payer: Self-pay | Admitting: Physician Assistant

## 2016-07-12 NOTE — Progress Notes (Signed)
   Assessment and Plan:  Mild episode of recurrent major depressive disorder (HCC) No SI/HI Look up CBT, suggest counseling Follow up 4-6 weeks stress management techniques discussed, increase water, good sleep hygiene discussed, increase exercise, and increase veggies.  -     buPROPion (WELLBUTRIN XL) 150 MG 24 hr tablet; Take 1 tablet (150 mg total) by mouth every morning. -     ALPRAZolam (XANAX) 0.5 MG tablet; 1/2-1 tablet as needed daily for anxiety  Other fatigue Check labs -     TSH -     Iron and TIBC -     Vitamin B12  No future appointments.   HPI 26 y.o.female presents for depression/anxiety. She has a history of depression, has been on zoloft in the past. 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 She has been on zoloft and lexapro in the past that did not help. She is having trouble with motivation, stays tired all the time, does not want to get out of bed.   Past Medical History:  Diagnosis Date  . 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   Marland Kitchen ADHD (attention deficit hyperactivity disorder)   . Anemia   . Asthma      No Known Allergies  No current outpatient prescriptions on file prior to visit.   No current facility-administered medications on file prior to visit.     ROS: all negative except above.   Physical Exam: Filed Weights   07/13/16 0927  Weight: 123 lb 9.6 oz (56.1 kg)   BP 122/78   Pulse 87   Temp 97.3 F (36.3 C)   Resp 16   Ht 5' 4.5" (1.638 m)   Wt 123 lb 9.6 oz (56.1 kg)   LMP 07/04/2016   SpO2 99%   BMI 20.89 kg/m  General Appearance: Well nourished, in no apparent distress. Eyes: PERRLA, EOMs, conjunctiva no swelling or erythema Sinuses: No Frontal/maxillary tenderness ENT/Mouth: Ext aud canals clear, TMs without erythema, bulging. No erythema, swelling, or exudate on post  pharynx.  Tonsils not swollen or erythematous. Hearing normal.  Neck: Supple, thyroid normal.  Respiratory: Respiratory effort normal, BS equal bilaterally without rales, rhonchi, wheezing or stridor.  Cardio: RRR with no MRGs. Brisk peripheral pulses without edema.  Abdomen: Soft, + BS.  Non tender, no guarding, rebound, hernias, masses. Lymphatics: Non tender without lymphadenopathy.  Musculoskeletal: Full ROM, 5/5 strength, normal gait.  Skin: Warm, dry without rashes, lesions, ecchymosis.  Neuro: Cranial nerves intact. Normal muscle tone, no cerebellar symptoms. Sensation intact.  Psych: Awake and oriented X 3, normal affect, Insight and Judgment appropriate.     Vicie Mutters, PA-C 9:36 AM [facility]

## 2016-07-13 ENCOUNTER — Ambulatory Visit (INDEPENDENT_AMBULATORY_CARE_PROVIDER_SITE_OTHER): Payer: 59 | Admitting: Physician Assistant

## 2016-07-13 ENCOUNTER — Encounter: Payer: Self-pay | Admitting: Physician Assistant

## 2016-07-13 VITALS — BP 122/78 | HR 87 | Temp 97.3°F | Resp 16 | Ht 64.5 in | Wt 123.6 lb

## 2016-07-13 DIAGNOSIS — R5383 Other fatigue: Secondary | ICD-10-CM | POA: Diagnosis not present

## 2016-07-13 DIAGNOSIS — F330 Major depressive disorder, recurrent, mild: Secondary | ICD-10-CM

## 2016-07-13 MED ORDER — BUPROPION HCL ER (XL) 150 MG PO TB24: 150.0000 mg | ORAL_TABLET | ORAL | 2 refills | Status: DC

## 2016-07-13 MED ORDER — ALPRAZOLAM 0.5 MG PO TABS: ORAL_TABLET | ORAL | 0 refills | Status: DC

## 2016-07-13 NOTE — Patient Instructions (Signed)
Look up cognitive behavioral therapy  Counseling services  Dr. Arbutus Leas, Ph.D. [address], [place] [address] Phone: [place], [place] [phone] [place] [address], [place] [address]  [facility] Tracie Hampton, NP [place].  Suite [phone] Fax [phone]   [facility] Hours: Monday-Thursday 830-8pm  Friday 830AM-7PM Address: [place] [phone] [place].  Address: [place], [place] [address] [facility] [phone] office [email or web address] [address] [place], [place], [place] [address]  Rema Fendt, therapist  Toy Cookey, MA, clinical psychologist  Cognitive-Behavior Therapy; Mood Disorders; Anxiety Disorders; adult and child ADHD; Family Therapy; Stress Management; personal growth, and Marital Therapy.    Terrance Mass Ph.D., clinical psychologist Cognitive-Behavior Therapy; Mood Disorders; Anxiety Disorders; Stress     Management  [facility] [address], [place], [place] [address] [phone]   [facility], psychotherapist [address] [place], [place] [address] [phone]  Karin [place] Ph.D., clinical psychologist [phone] office [place], [place] [address] Cognitive Behavior Therapy, Depression, Bipolar, Anxiety, Grief and Loss       Major Depressive Disorder, Adult Major depressive disorder (MDD) is a mental health condition. MDD often makes you feel sad, hopeless, or helpless. MDD can also cause symptoms in your body. MDD can affect your:  Work.  School.  Relationships.  Other normal activities.  MDD can range from mild to very bad. It may occur once (single episode MDD). It can also occur many times (recurrent MDD). The main symptoms of MDD  often include:  Feeling sad, depressed, or irritable most of the time.  Loss of interest.  MDD symptoms also include:  Sleeping too much or too little.  Eating too much or too little.  A change in your weight.  Feeling tired (fatigue) or having low energy.  Feeling worthless.  Feeling guilty.  Trouble making decisions.  Trouble thinking clearly.  Thoughts of suicide or harming others.  Feeling weak.  Feeling agitated.  Keeping yourself from being around other people (isolation).  Follow these instructions at home: Activity  Do these things as told by your doctor: ? Go back to your normal activities. ? Exercise regularly. ? Spend time outdoors. Alcohol  Talk with your doctor about how alcohol can affect your antidepressant medicines.  Do not drink alcohol. Or, limit how much alcohol you drink. ? This means no more than 1 drink a day for nonpregnant women and 2 drinks a day for men. One drink equals one of these:  12 oz of beer.  5 oz of wine.  1 oz of hard liquor. General instructions  Take over-the-counter and prescription medicines only as told by your doctor.  Eat a healthy diet.  Get plenty of sleep.  Find activities that you enjoy. Make time to do them.  Think about joining a support group. Your doctor may be able to suggest a group for you.  Keep all follow-up visits as told by your doctor. This is important. Where to find more information:  [employer] on Mental Illness: ? www.nami.[place]: ? https://carter.com/  National Suicide Prevention Lifeline: ? (647)241-4203. This is free, 24-hour help. Contact a doctor if:  Your symptoms get worse.  You have new symptoms. Get help right away if:  You self-harm.  You see, hear, taste,  smell, or feel things that are not present (hallucinate). If you ever feel like you may hurt yourself or others, or have thoughts about taking your own life, get  help right away. You can go to your nearest emergency department or call:  Your local emergency services (911 in the [place]  A suicide crisis helpline, such as the National Suicide Prevention Lifeline: ? 434 275 7708. This is open 24 hours a day.  This information is not intended to replace advice given to you by your health care provider. Make sure you discuss any questions you have with your health care provider. Document Released: 12/24/2014 Document Revised: 09/29/2015 Document Reviewed: 09/29/2015 Elsevier Interactive Patient Education  2017 [employer].

## 2016-07-14 LAB — IRON AND TIBC
%SAT: 35 % (ref 11–50)
Iron: 106 ug/dL (ref 40–190)
TIBC: 301 ug/dL (ref 250–450)
UIBC: 195 ug/dL

## 2016-07-14 LAB — VITAMIN B12: VITAMIN B 12: 394 pg/mL (ref 200–1100)

## 2016-07-14 LAB — TSH: TSH: 1.8 mIU/L

## 2016-08-19 NOTE — Progress Notes (Deleted)
   Assessment and Plan:    HPI 26 y.o.female presents for follow up for anxiety/depression, started on wellbutrin last OV.   Past Medical History:  Diagnosis Date  . 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   Marland Kitchen ADHD (attention deficit hyperactivity disorder)   . Anemia   . Asthma      No Known Allergies  Current Outpatient Prescriptions on File Prior to Visit  Medication Sig  . ALPRAZolam (XANAX) 0.5 MG tablet 1/2-1 tablet as needed daily for anxiety  . buPROPion (WELLBUTRIN XL) 150 MG 24 hr tablet Take 1 tablet (150 mg total) by mouth every morning.   No current facility-administered medications on file prior to visit.     ROS: all negative except above.   Physical Exam: There were no vitals filed for this visit. There were no vitals taken for this visit. General Appearance: Well nourished, in no apparent distress. Eyes: PERRLA, EOMs, conjunctiva no swelling or erythema Sinuses: No Frontal/maxillary tenderness ENT/Mouth: Ext aud canals clear, TMs without erythema, bulging. No erythema, swelling, or exudate on post pharynx.  Tonsils not swollen or erythematous. Hearing normal.  Neck: Supple, thyroid normal.  Respiratory: Respiratory effort normal, BS equal bilaterally without rales, rhonchi, wheezing or stridor.  Cardio: RRR with no MRGs. Brisk peripheral pulses without edema.  Abdomen: Soft, + BS.  Non tender, no guarding, rebound, hernias, masses. Lymphatics: Non tender without lymphadenopathy.  Musculoskeletal: Full ROM, 5/5 strength, normal gait.  Skin: Warm, dry without rashes, lesions, ecchymosis.  Neuro: Cranial nerves intact. Normal muscle tone, no cerebellar symptoms. Sensation intact.  Psych: Awake and oriented X 3, normal affect, Insight and Judgment appropriate.     Vicie Mutters, PA-C 7:34 AM [facility]

## 2016-08-20 ENCOUNTER — Ambulatory Visit: Payer: Self-pay | Admitting: Physician Assistant

## 2016-12-14 ENCOUNTER — Telehealth: Payer: Self-pay | Admitting: Internal Medicine

## 2016-12-14 DIAGNOSIS — F4323 Adjustment disorder with mixed anxiety and depressed mood: Secondary | ICD-10-CM

## 2016-12-14 NOTE — Telephone Encounter (Signed)
Patient called to request medical referral to [employer]. She called to make appointment, and was advised a medical referral was required. [phone]

## 2016-12-14 NOTE — Addendum Note (Signed)
Addended by: Vicie Mutters R on: 12/14/2016 12:25 PM   Modules accepted: Orders

## 2017-04-13 ENCOUNTER — Ambulatory Visit: Payer: Self-pay | Admitting: Physician Assistant

## 2017-04-14 ENCOUNTER — Other Ambulatory Visit: Payer: Self-pay | Admitting: Adult Health

## 2017-04-14 DIAGNOSIS — H1030 Unspecified acute conjunctivitis, unspecified eye: Secondary | ICD-10-CM

## 2017-04-14 MED ORDER — CIPROFLOXACIN HCL 0.3 % OP SOLN: OPHTHALMIC | 0 refills | Status: DC

## 2017-05-31 ENCOUNTER — Encounter: Payer: Self-pay | Admitting: Adult Health

## 2017-05-31 ENCOUNTER — Ambulatory Visit: Payer: 59 | Admitting: Adult Health

## 2017-05-31 VITALS — BP 92/60 | HR 106 | Temp 97.7°F | Ht 64.5 in | Wt 133.0 lb

## 2017-05-31 DIAGNOSIS — G933 Postviral fatigue syndrome: Secondary | ICD-10-CM | POA: Diagnosis not present

## 2017-05-31 DIAGNOSIS — J209 Acute bronchitis, unspecified: Secondary | ICD-10-CM

## 2017-05-31 DIAGNOSIS — G9331 Postviral fatigue syndrome: Secondary | ICD-10-CM

## 2017-05-31 DIAGNOSIS — F988 Other specified behavioral and emotional disorders with onset usually occurring in childhood and adolescence: Secondary | ICD-10-CM | POA: Diagnosis not present

## 2017-05-31 MED ORDER — LISDEXAMFETAMINE DIMESYLATE 40 MG PO CAPS: ORAL_CAPSULE | ORAL | 0 refills | Status: DC

## 2017-05-31 MED ORDER — PROMETHAZINE-DM 6.25-15 MG/5ML PO SYRP: 5.0000 mL | ORAL_SOLUTION | Freq: Four times a day (QID) | ORAL | 1 refills | Status: DC | PRN

## 2017-05-31 MED ORDER — AZITHROMYCIN 250 MG PO TABS: ORAL_TABLET | ORAL | 1 refills | Status: AC

## 2017-05-31 MED ORDER — PREDNISONE 20 MG PO TABS: ORAL_TABLET | ORAL | 0 refills | Status: DC

## 2017-05-31 NOTE — Progress Notes (Signed)
Assessment and Plan:  Melissa Cantu was seen today for cough, fatigue and headache.  Diagnoses and all orders for this visit:  Acute bronchitis, unspecified organism -     azithromycin (ZITHROMAX) 250 MG tablet; Take 2 tablets (500 mg) on  Day 1,  followed by 1 tablet (250 mg) once daily on Days 2 through 5.  Post viral syndrome Superimposed on allergic rhinitis, progressing into sinusitis Suggested symptomatic OTC remedies. Advised to stop smoking Nasal saline spray for congestion. Nasal steroids, allergy pill, oral steroids Follow up as needed. -     promethazine-dextromethorphan (PROMETHAZINE-DM) 6.25-15 MG/5ML syrup; Take 5 mLs by mouth 4 (four) times daily as needed for cough. -     predniSONE (DELTASONE) 20 MG tablet; 2 tablets daily for 3 days, 1 tablet daily for 4 days.  Attention deficit disorder, unspecified hyperactivity presence Patient was counseled on the addictive nature of the medication and to use on days that she works only to avoid tolerance/addiction. Prescription should las much longer than 3 months.  -     lisdexamfetamine (VYVANSE) 40 MG capsule; Take 1 tab by mouth as needed on days that you work only. Follow up in 3 months  Further disposition pending results of labs. Discussed med's effects and SE's.   Over 30 minutes of exam, counseling, chart review, and critical decision making was performed.   Future Appointments  Date Time Provider [place]  09/22/2017  9:30 AM Liane Comber, NP [facility] None    ------------------------------------------------------------------------------------------------------------------   HPI BP 92/60   Pulse (!) 106   Temp 97.7 F (36.5 C)   Ht 5' 4.5" (1.638 m)   Wt 133 lb (60.3 kg)   SpO2 96%   BMI 22.48 kg/m   26 y.o.female presents for evaluation - she reports she was diagnosed with type A influenza after presenting to urgent care 2 weeks ago. She reports this was her second round of influenza this year.  She was treated by 5 day course of tamiflu. Reports she was feeling much better, but continues to have nagging cough (non-productive), and newly having body aches/headache (responds well to ibuprofen 800 mg), sense of fever/chills last night. She also reports facial pressure/pain. She reports she has been coughing so hard she has had nausea and an episode of emesis. She has tried robitussin - DM and this has been helpful but "knocks me out."   She has hx of allergies and takes xyzal daily. She does continue to smoke daily.   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 She has documented hx of treatment by vyvanse 40 mg; reports she did very well with this and wants to discuss restarting.   Past Medical History:  Diagnosis Date  . 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   Marland Kitchen ADHD (attention deficit hyperactivity disorder)   . Anemia   . Asthma      No Known Allergies  Current Outpatient Medications on File Prior to Visit  Medication Sig  . ALPRAZolam (XANAX) 0.5 MG tablet 1/2-1 tablet as needed daily for anxiety  . buPROPion (WELLBUTRIN XL) 150 MG 24 hr tablet Take 1 tablet (150 mg total) by mouth every morning.  . ciprofloxacin (CILOXAN) 0.3 % ophthalmic solution 2 gtt in eye(s) Q2Hours while awake for 2 days, then 2 gtt in eye(s) Q4Hours for 5 days.  . Levocetirizine Dihydrochloride (  XYZAL PO) Take by mouth.   No current facility-administered medications on file prior to visit.     ROS: all negative except above.   Physical Exam:  BP 92/60   Pulse (!) 106   Temp 97.7 F (36.5 C)   Ht 5' 4.5" (1.638 m)   Wt 133 lb (60.3 kg)   SpO2 96%   BMI 22.48 kg/m   General Appearance: Well nourished, in no acute distress. Eyes: PERRLA, EOMs, conjunctiva no swelling or erythema Sinuses: No Frontal/maxillary tenderness ENT/Mouth: Ext aud canals  clear, TMs without erythema, bulging. No erythema, swelling, or exudate on post pharynx.  Tonsils not swollen or erythematous. Hearing normal.  Neck: Supple, thyroid normal.  Respiratory: Respiratory effort normal, BS coarse throughout, particularly in bases with coughing/rhonchi, no rales, wheezing or stridor.  Cardio: RRR with no MRGs. Brisk peripheral pulses without edema.  Abdomen: Soft, + BS.  Non tender, no guarding, rebound, hernias, masses. Lymphatics: Non tender without lymphadenopathy.  Musculoskeletal: Full ROM, 5/5 strength, normal gait.  Skin: Warm, dry without rashes, lesions, ecchymosis.  Neuro: Cranial nerves intact. Normal muscle tone, no cerebellar symptoms. Sensation intact.  Psych: Awake and oriented X 3, normal affect, Insight and Judgment appropriate.     Izora Ribas, NP 10:34 AM [facility]

## 2017-05-31 NOTE — Patient Instructions (Signed)

## 2017-08-06 ENCOUNTER — Other Ambulatory Visit: Payer: Self-pay | Admitting: Adult Health

## 2017-08-06 ENCOUNTER — Encounter: Payer: Self-pay | Admitting: Adult Health

## 2017-08-06 DIAGNOSIS — G9331 Postviral fatigue syndrome: Secondary | ICD-10-CM

## 2017-08-06 DIAGNOSIS — G933 Postviral fatigue syndrome: Secondary | ICD-10-CM

## 2017-08-06 MED ORDER — PREDNISONE 20 MG PO TABS: ORAL_TABLET | ORAL | 0 refills | Status: DC

## 2017-08-08 ENCOUNTER — Encounter: Payer: Self-pay | Admitting: Adult Health

## 2017-08-17 ENCOUNTER — Other Ambulatory Visit: Payer: Self-pay

## 2017-08-17 DIAGNOSIS — F988 Other specified behavioral and emotional disorders with onset usually occurring in childhood and adolescence: Secondary | ICD-10-CM

## 2017-08-17 MED ORDER — LISDEXAMFETAMINE DIMESYLATE 40 MG PO CAPS: ORAL_CAPSULE | ORAL | 0 refills | Status: DC

## 2017-08-17 NOTE — Telephone Encounter (Signed)
Refill request for Vyvanse. PMP checked, last filled on 05/31/17, #90, 90 day supply. Last office visit on 05/31/17, next office visit on 09/22/17. In que for your review with a DNF date

## 2017-08-25 ENCOUNTER — Other Ambulatory Visit: Payer: Self-pay | Admitting: Adult Health

## 2017-08-25 ENCOUNTER — Telehealth: Payer: Self-pay

## 2017-08-25 DIAGNOSIS — G933 Postviral fatigue syndrome: Secondary | ICD-10-CM

## 2017-08-25 DIAGNOSIS — G9331 Postviral fatigue syndrome: Secondary | ICD-10-CM

## 2017-08-25 MED ORDER — DOXYCYCLINE HYCLATE 100 MG PO CAPS: 100.0000 mg | ORAL_CAPSULE | Freq: Two times a day (BID) | ORAL | 0 refills | Status: AC

## 2017-08-25 MED ORDER — PREDNISONE 20 MG PO TABS: ORAL_TABLET | ORAL | 0 refills | Status: DC

## 2017-08-25 MED ORDER — PROMETHAZINE-DM 6.25-15 MG/5ML PO SYRP: 5.0000 mL | ORAL_SOLUTION | Freq: Four times a day (QID) | ORAL | 1 refills | Status: DC | PRN

## 2017-08-25 NOTE — Telephone Encounter (Signed)
Patient has been sick for 1 day, symptoms include chills, sinus drainage/pressure, sneezing, wheezing, chest pain, cough with mucus that is green in color. Nose feels dry, can't breathe and jaw hurts. Requesting an antibiotic.

## 2017-08-26 NOTE — Telephone Encounter (Signed)
Left message on voicemail.

## 2017-09-06 NOTE — Progress Notes (Signed)
Assessment and Plan:  Melissa Cantu was seen today for cough.  Diagnoses and all orders for this visit:  Productive cough/ Cough present for greater than 3 weeks Due to reported nocturnal fevers ~100 degrees, ongoing productive cough, intermittently foul tasting sputum, will repeat CXR to rule out TB, walking pneumonia Alternately discussed chronic bronchitis; if labs and imaging do not clarify etiology and cough and fevers continue despite treatment, will send to pulmonology for eval Strongly recommended she quit smoking -     DG Chest 2 View; Future -     CBC with Differential/Platelet -     COMPLETE METABOLIC PANEL WITH GFR -     predniSONE (DELTASONE) 20 MG tablet; 2 tablets daily for 3 days, 1 tablet daily for 4 days. -     levofloxacin (LEVAQUIN) 750 MG tablet; Take 1 tablet (750 mg total) by mouth daily for 5 days. -     promethazine-dextromethorphan (PROMETHAZINE-DM) 6.25-15 MG/5ML syrup; Take 5 mLs by mouth 4 (four) times daily as needed for cough.  Keep follow up OV in 2 weeks  Attention deficit disorder, unspecified hyperactivity presence -     lisdexamfetamine (VYVANSE) 40 MG capsule; Take 1 tab by mouth as needed on days that you work only.  Further disposition pending results of labs. Discussed med's effects and SE's.   Over 30 minutes of exam, counseling, chart review, and critical decision making was performed.   Future Appointments  Date Time Provider [place]  09/22/2017  9:30 AM Liane Comber, NP [facility] None    ------------------------------------------------------------------------------------------------------------------   HPI BP 104/70   Pulse 86   Temp 97.7 F (36.5 C)   Ht 5' 4.5" (1.638 m)   Wt 130 lb (59 kg)   SpO2 97%   BMI 21.97 kg/m   26 y.o.female, current smoker (~1 pack daily) with hx of childhood asthma (not recently requiring medications) presents for ongoing cough for 3-4 weeks; she had cold symptoms 2 weeks prior to onset that  resolved after a course of prednisone; she reports current symptoms (deep cough, productive of thick mucus, night time fevers ~100 degrees F) started 1 week after completing prednisone course. She was seen at ER and reports she had CXR, abd CT (for RLQ abd pain), strep, flu swab, CBC, CMP, EKG - results  briefly reviewed on patient's phone - which appear to have been unremarkable overall. She reports she was exposed to a family member with pneumonia a few weeks ago. She reports personal hx of bronchitis. She reports phlegm is intermittently very foul-tasting.   She reports she was prescribed prednisone and doxycycline which didn't help, tried leftover tessalon which made cough worse. She has tried other OTC cough medications without benefit. She is on zyxal which controls her mild allergies.   Past Medical History:  Diagnosis Date  . 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   Marland Kitchen ADHD (attention deficit hyperactivity disorder)   . Anemia   . Asthma      No Known Allergies  Current Outpatient Medications on File Prior to Visit  Medication Sig  . ALPRAZolam (XANAX) 0.5 MG tablet 1/2-1 tablet as needed daily for anxiety  . Levocetirizine Dihydrochloride (XYZAL PO) Take by mouth.  Marland Kitchen buPROPion (WELLBUTRIN XL) 150 MG 24 hr tablet Take 1 tablet (150 mg total) by mouth every morning. (Patient not taking: Reported on 09/07/2017)  . ciprofloxacin (CILOXAN) 0.3 % ophthalmic solution 2 gtt in eye(s) Q2Hours  while awake for 2 days, then 2 gtt in eye(s) Q4Hours for 5 days. (Patient not taking: Reported on 09/07/2017)   No current facility-administered medications on file prior to visit.     ROS: Review of Systems  Constitutional: Positive for chills, fever and malaise/fatigue. Negative for diaphoresis and weight loss.  HENT: Negative.   Respiratory: Positive for cough and sputum production. Negative for hemoptysis, shortness of breath and wheezing.    Cardiovascular: Positive for leg swelling (Works on her feet). Negative for chest pain, palpitations, orthopnea, claudication and PND.  Gastrointestinal: Negative.   Genitourinary: Negative.   Musculoskeletal: Positive for myalgias.  Skin: Negative for rash.  Neurological: Negative for dizziness and headaches.  Endo/Heme/Allergies: Positive for environmental allergies. Negative for polydipsia.    Physical Exam:  BP 104/70   Pulse 86   Temp 97.7 F (36.5 C)   Ht 5' 4.5" (1.638 m)   Wt 130 lb (59 kg)   SpO2 97%   BMI 21.97 kg/m   General Appearance: Well nourished, in no apparent distress. Eyes: PERRLA, EOMs, conjunctiva no swelling or erythema Sinuses: No Frontal/maxillary tenderness ENT/Mouth: Ext aud canals clear, TMs without erythema, bulging. No erythema, swelling, or exudate on post pharynx.  Tonsils not swollen or erythematous. Hearing normal.  Neck: Supple, thyroid normal.  Respiratory: Respiratory effort normal, BS equal bilaterally without rales, rhonchi, wheezing or stridor. Deep occasional bronchitic junky cough heard while in office.  Cardio: RRR with no MRGs. Brisk peripheral pulses without edema.  Abdomen: Soft, + BS.  Non tender, no guarding, rebound, hernias, masses. Lymphatics: Non tender without lymphadenopathy.  Musculoskeletal: normal gait.  Skin: Warm, dry without rashes, lesions, ecchymosis.  Psych: Awake and oriented X 3, normal affect, Insight and Judgment appropriate.     Izora Ribas, NP 5:51 PM [facility]

## 2017-09-07 ENCOUNTER — Ambulatory Visit: Payer: 59 | Admitting: Adult Health

## 2017-09-07 ENCOUNTER — Encounter: Payer: Self-pay | Admitting: Adult Health

## 2017-09-07 VITALS — BP 104/70 | HR 86 | Temp 97.7°F | Ht 64.5 in | Wt 130.0 lb

## 2017-09-07 DIAGNOSIS — F17200 Nicotine dependence, unspecified, uncomplicated: Secondary | ICD-10-CM | POA: Diagnosis not present

## 2017-09-07 DIAGNOSIS — R05 Cough: Secondary | ICD-10-CM

## 2017-09-07 DIAGNOSIS — R509 Fever, unspecified: Secondary | ICD-10-CM | POA: Diagnosis not present

## 2017-09-07 DIAGNOSIS — R058 Other specified cough: Secondary | ICD-10-CM

## 2017-09-07 DIAGNOSIS — F988 Other specified behavioral and emotional disorders with onset usually occurring in childhood and adolescence: Secondary | ICD-10-CM | POA: Diagnosis not present

## 2017-09-07 MED ORDER — LISDEXAMFETAMINE DIMESYLATE 40 MG PO CAPS: ORAL_CAPSULE | ORAL | 0 refills | Status: DC

## 2017-09-07 MED ORDER — LEVOFLOXACIN 750 MG PO TABS: 750.0000 mg | ORAL_TABLET | Freq: Every day | ORAL | 0 refills | Status: AC

## 2017-09-07 MED ORDER — PREDNISONE 20 MG PO TABS: ORAL_TABLET | ORAL | 0 refills | Status: DC

## 2017-09-07 MED ORDER — PROMETHAZINE-DM 6.25-15 MG/5ML PO SYRP: 5.0000 mL | ORAL_SOLUTION | Freq: Four times a day (QID) | ORAL | 1 refills | Status: DC | PRN

## 2017-09-07 NOTE — Patient Instructions (Signed)
Common causes of cough OR hoarseness OR sore throat:   Allergies, Viral Infections, Acid Reflux and Bacterial Infections.   Allergies and viral infections cause a cough OR sore throat by post nasal drip and are often worse at night, can also have sneezing, lower grade fevers, clear/yellow mucus. This is best treated with allergy medications or nasal sprays.  Please get on allegra for 1-2 weeks The strongest is allegra or fexafinadine  Cheapest at [employer], [employer], [employer]   Bacterial infections are more severe than allergies or viral infections with fever, teeth pain, fatigue. This can be treated with prednisone and the same over the counter medication and after 7 days can be treated with an antibiotic.   Silent reflux/GERD can cause a cough OR sore throat OR hoarseness WITHOUT heart burn because the esophagus that goes to the stomach and trachea that goes to the lungs are very close and when you lay down the acid can irritate your throat and lungs. This can cause hoarseness, cough, and wheezing. Please stop any alcohol or anti-inflammatories like aleve/advil/ibuprofen and start an over the counter Prilosec or omeprazole 1-2 times daily 33mins before food for 2 weeks, then switch to over the counter zantac/ratinidine or pepcid/famotadine once at night for 2 weeks.    sometimes irritation causes more irritation. Try voice rest, use sugar free cough drops to prevent coughing, and try to stop clearing your throat.   If you ever have a cough that does not go away after trying these things please make a follow up visit for further evaluation or we can refer you to a specialist. Or if you ever have shortness of breath or chest pain go to the ER.      Levofloxacin tablets What is this medicine? LEVOFLOXACIN (lee voe FLOX a sin) is a quinolone antibiotic. It is used to treat certain kinds of bacterial infections. It will not work for colds, flu, or other viral infections. This medicine may be used for  other purposes; ask your health care provider or pharmacist if you have questions. COMMON BRAND NAME(S): Levaquin, Levaquin Leva-Pak What should I tell my health care provider before I take this medicine? They need to know if you have any of these conditions: -bone problems -diabetes -history of low levels of potassium in the blood -irregular heartbeat -joint problems -kidney disease -liver disease -myasthenia gravis -seizures -tendon problems -tingling of the fingers or toes, or other nerve disorder -an unusual or allergic reaction to levofloxacin, other quinolone antibiotics, foods, dyes, or preservatives -pregnant or trying to get pregnant -breast-feeding How should I use this medicine? Take this medicine by mouth with a full glass of water. Follow the directions on the prescription label. This medicine can be taken with or without food. Take your medicine at regular intervals. Do not take your medicine more often than directed. Do not skip doses or stop your medicine early even if you feel better. Do not stop taking except on your doctor's advice. A special MedGuide will be given to you by the pharmacist with each prescription and refill. Be sure to read this information carefully each time. Talk to your pediatrician regarding the use of this medicine in children. While this drug may be prescribed for children as young as 6 months for selected conditions, precautions do apply. Overdosage: If you think you have taken too much of this medicine contact a poison control center or emergency room at once. NOTE: This medicine is only for you. Do not share this medicine  with others. What if I miss a dose? If you miss a dose, take it as soon as you remember. If it is almost time for your next dose, take only that dose. Do not take double or extra doses. What may interact with this medicine? Do not take this medicine with any of the following medications: -bepridil -certain medicines for  depression, anxiety, or psychotic disturbances like pimozide, thioridazine, and ziprasidone -certain medicines for irregular heart beat like dofetilide and dronedarone -cisapride -halofantrine This medicine may also interact with the following medications: -antacids -birth control pills -certain medicines for diabetes, like glipizide, glyburide, or insulin -didanosine buffered tablets or powder -multivitamins -NSAIDS, medicines for pain and inflammation, like ibuprofen or naproxen -steroid medicines like prednisone or cortisone -sucralfate -theophylline -warfarin This list may not describe all possible interactions. Give your health care provider a list of all the medicines, herbs, non-prescription drugs, or dietary supplements you use. Also tell them if you smoke, drink alcohol, or use illegal drugs. Some items may interact with your medicine. What should I watch for while using this medicine? Tell your doctor or healthcare professional if your symptoms do not start to get better or if they get worse. Do not treat diarrhea with over the counter products. Contact your doctor if you have diarrhea that lasts more than 2 days or if it is severe and watery. Check with your doctor or health care professional if you get an attack of severe diarrhea, nausea and vomiting, or if you sweat a lot. The loss of too much body fluid can make it dangerous for you to take this medicine. You may get drowsy or dizzy. Do not drive, use machinery, or do anything that needs mental alertness until you know how this medicine affects you. Do not sit or stand up quickly, especially if you are an older patient. This reduces the risk of dizzy or fainting spells. This medicine can make you more sensitive to the sun. Keep out of the sun. If you cannot avoid being in the sun, wear protective clothing and use a sunscreen. Do not use sun lamps or tanning beds/booths. Contact your doctor if you get a sunburn. If you are a  diabetic monitor your blood glucose carefully. If you get an unusual reading stop taking this medicine and call your doctor right away. Avoid antacids, calcium, iron, and zinc products for 2 hours before and 2 hours after taking a dose of this medicine. What side effects may I notice from receiving this medicine? Side effects that you should report to your doctor or health care professional as soon as possible: -allergic reactions like skin rash or hives, swelling of the face, lips, or tongue -anxious -breathing problems -confusion -depressed mood -diarrhea -dizziness -fast, irregular heartbeat -hallucination, loss of contact with reality -joint, muscle, or tendon pain or swelling -muscle weakness -pain, tingling, numbness in the hands or feet -seizures -signs and symptoms of high blood sugar such as dizziness; dry mouth; dry skin; fruity breath; nausea; stomach pain; increased hunger or thirst; increased urination -signs and symptoms of liver injury like dark yellow or brown urine; general ill feeling or flu-like symptoms; light-colored stools; loss of appetite; nausea; right upper belly pain; unusually weak or tired; yellowing of the eyes or skin -signs and symptoms of low blood sugar such as feeling anxious; confusion; dizziness; increased hunger; unusually weak or tired; sweating; shakiness; cold; irritable; headache; blurred vision; fast heartbeat; loss of consciousness -suicidal thoughts or other mood changes -sunburn -unusually weak or  tired Side effects that usually do not require medical attention (report to your doctor or health care professional if they continue or are bothersome): -constipation -dry mouth -headache -nausea, vomiting -trouble sleeping This list may not describe all possible side effects. Call your doctor for medical advice about side effects. You may report side effects to FDA at 1-800-FDA-1088. Where should I keep my medicine? Keep out of the reach of  children. Store at room temperature between 15 and 30 degrees C (59 and 86 degrees F). Keep in a tightly closed container. Throw away any unused medicine after the expiration date. NOTE: This sheet is a summary. It may not cover all possible information. If you have questions about this medicine, talk to your doctor, pharmacist, or health care provider.  2018 [employer]vier/Gold Standard (2015-07-23 12:38:27)

## 2017-09-08 ENCOUNTER — Ambulatory Visit (HOSPITAL_COMMUNITY)
Admission: RE | Admit: 2017-09-08 | Discharge: 2017-09-08 | Disposition: A | Discharge status: Home / Self Care | Payer: 59 | Source: Ambulatory Visit | Attending: Adult Health | Admitting: Adult Health

## 2017-09-08 ENCOUNTER — Other Ambulatory Visit: Payer: Self-pay | Admitting: Adult Health

## 2017-09-08 DIAGNOSIS — J984 Other disorders of lung: Secondary | ICD-10-CM | POA: Insufficient documentation

## 2017-09-08 DIAGNOSIS — R058 Other specified cough: Secondary | ICD-10-CM

## 2017-09-08 DIAGNOSIS — R05 Cough: Secondary | ICD-10-CM | POA: Diagnosis present

## 2017-09-08 DIAGNOSIS — J181 Lobar pneumonia, unspecified organism: Principal | ICD-10-CM

## 2017-09-08 DIAGNOSIS — J189 Pneumonia, unspecified organism: Secondary | ICD-10-CM

## 2017-09-08 LAB — COMPLETE METABOLIC PANEL WITH GFR
AG RATIO: 1.7 (calc) (ref 1.0–2.5)
ALT: 12 U/L (ref 6–29)
AST: 10 U/L (ref 10–30)
Albumin: 4.2 g/dL (ref 3.6–5.1)
Alkaline phosphatase (APISO): 58 U/L (ref 33–115)
BILIRUBIN TOTAL: 0.3 mg/dL (ref 0.2–1.2)
BUN: 9 mg/dL (ref 7–25)
CHLORIDE: 108 mmol/L (ref 98–110)
CO2: 26 mmol/L (ref 20–32)
Calcium: 9.7 mg/dL (ref 8.6–10.2)
Creat: 0.63 mg/dL (ref 0.50–1.10)
GFR, EST AFRICAN AMERICAN: 146 mL/min/{1.73_m2} (ref >=60)
GFR, Est Non African American: 126 mL/min/{1.73_m2} (ref >=60)
Globulin: 2.5 g/dL (calc) (ref 1.9–3.7)
Glucose, Bld: 96 mg/dL (ref 65–99)
POTASSIUM: 4.4 mmol/L (ref 3.5–5.3)
Sodium: 143 mmol/L (ref 135–146)
Total Protein: 6.7 g/dL (ref 6.1–8.1)

## 2017-09-08 LAB — CBC WITH DIFFERENTIAL/PLATELET
Basophils Absolute: 78 cells/uL (ref 0–200)
Basophils Relative: 0.5 %
EOS ABS: 217 {cells}/uL (ref 15–500)
Eosinophils Relative: 1.4 %
HCT: 37 % (ref 35.0–45.0)
Hemoglobin: 12.4 g/dL (ref 11.7–15.5)
Lymphs Abs: 3023 cells/uL (ref 850–3900)
MCH: 29.8 pg (ref 27.0–33.0)
MCHC: 33.5 g/dL (ref 32.0–36.0)
MCV: 88.9 fL (ref 80.0–100.0)
MPV: 10.5 fL (ref 7.5–12.5)
Monocytes Relative: 6.8 %
Neutro Abs: 11129 cells/uL — ABNORMAL HIGH (ref 1500–7800)
Neutrophils Relative %: 71.8 %
PLATELETS: 392 10*3/uL (ref 140–400)
RBC: 4.16 10*6/uL (ref 3.80–5.10)
RDW: 12.3 % (ref 11.0–15.0)
TOTAL LYMPHOCYTE: 19.5 %
WBC: 15.5 10*3/uL — ABNORMAL HIGH (ref 3.8–10.8)
WBCMIX: 1054 {cells}/uL — AB (ref 200–950)

## 2017-09-21 NOTE — Progress Notes (Deleted)
FOLLOW UP  Assessment and Plan:   Diagnoses and all orders for this visit:  Adjustment disorder with mixed anxiety and depressed mood  Attention deficit disorder, unspecified hyperactivity presence  Smoker  Pneumonia of left lower lobe due to infectious organism ([place])    Continue diet and meds as discussed. Further disposition pending results of labs. Discussed med's effects and SE's.   Over 30 minutes of exam, counseling, chart review, and critical decision making was performed.   Future Appointments  Date Time Provider [place]  09/22/2017  9:30 AM Liane Comber, NP [facility] None    ----------------------------------------------------------------------------------------------------------------------  HPI 27 y.o. female  presents for 3 month follow up on depression/anxiety due to adjustment disorder, ADD, and for LLL pneumonia.   She was seen on 09/08/2017 for productive cough with nocturnal fevers and was noted to have a LLL pneumonia by XR; she was treated with prednisone and levaquin as she had failed zpak.  ***  she currently continues to smoke *** pack a day; discussed risks associated with smoking, patient {ACTION; IS/IS [id number]} ready to quit.   she has a diagnosis of depression/anxiety and is currently on ***, reports symptoms are*** well controlled on current regimen. she   Patient is on an ADD medication, she states that the medication is helping and she denies any adverse reactions.   Today their BP is    She {DOES_DOES WNI:62703} workout. She denies chest pain, shortness of breath, dizziness.   BMI is There is no height or weight on file to calculate BMI., she {HAS HAS JKK:93818} been working on diet and exercise. Wt Readings from Last 3 Encounters:  09/07/17 130 lb (59 kg)  05/31/17 133 lb (60.3 kg)  07/13/16 123 lb 9.6 oz (56.1 kg)     Current Medications:  Current Outpatient Medications on File Prior to Visit  Medication Sig  .  ALPRAZolam (XANAX) 0.5 MG tablet 1/2-1 tablet as needed daily for anxiety  . buPROPion (WELLBUTRIN XL) 150 MG 24 hr tablet Take 1 tablet (150 mg total) by mouth every morning. (Patient not taking: Reported on 09/07/2017)  . ciprofloxacin (CILOXAN) 0.3 % ophthalmic solution 2 gtt in eye(s) Q2Hours while awake for 2 days, then 2 gtt in eye(s) Q4Hours for 5 days. (Patient not taking: Reported on 09/07/2017)  . Levocetirizine Dihydrochloride (XYZAL PO) Take by mouth.  . lisdexamfetamine (VYVANSE) 40 MG capsule Take 1 tab by mouth as needed on days that you work only.  . predniSONE (DELTASONE) 20 MG tablet 2 tablets daily for 3 days, 1 tablet daily for 4 days.  . promethazine-dextromethorphan (PROMETHAZINE-DM) 6.25-15 MG/5ML syrup Take 5 mLs by mouth 4 (four) times daily as needed for cough.   No current facility-administered medications on file prior to visit.      Allergies: No Known Allergies   Medical History:  Past Medical History:  Diagnosis Date  . 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   Marland Kitchen ADHD (attention deficit hyperactivity disorder)   . Anemia   . Asthma    Family history- Reviewed and unchanged Social history- Reviewed and unchanged   Review of Systems:  Review of Systems  Constitutional: Negative for malaise/fatigue and weight loss.  HENT: Negative for hearing loss and tinnitus.   Eyes: Negative for blurred vision and double vision.  Respiratory: Negative for cough, shortness of breath and wheezing.   Cardiovascular: Negative for chest pain, palpitations, orthopnea, claudication and leg swelling.  Gastrointestinal: Negative for abdominal pain, blood in stool, constipation, diarrhea, heartburn, melena, nausea and vomiting.  Genitourinary: Negative.   Musculoskeletal: Negative for joint pain and myalgias.  Skin: Negative for rash.  Neurological: Negative for dizziness, tingling, sensory change, weakness and headaches.   Endo/Heme/Allergies: Negative for polydipsia.  Psychiatric/Behavioral: Negative.   All other systems reviewed and are negative.     Physical Exam: There were no vitals taken for this visit. Wt Readings from Last 3 Encounters:  09/07/17 130 lb (59 kg)  05/31/17 133 lb (60.3 kg)  07/13/16 123 lb 9.6 oz (56.1 kg)   General Appearance: Well nourished, in no apparent distress. Eyes: PERRLA, EOMs, conjunctiva no swelling or erythema Sinuses: No Frontal/maxillary tenderness ENT/Mouth: Ext aud canals clear, TMs without erythema, bulging. No erythema, swelling, or exudate on post pharynx.  Tonsils not swollen or erythematous. Hearing normal.  Neck: Supple, thyroid normal.  Respiratory: Respiratory effort normal, BS equal bilaterally without rales, rhonchi, wheezing or stridor.  Cardio: RRR with no MRGs. Brisk peripheral pulses without edema.  Abdomen: Soft, + BS.  Non tender, no guarding, rebound, hernias, masses. Lymphatics: Non tender without lymphadenopathy.  Musculoskeletal: Full ROM, 5/5 strength, {PSY - GAIT AND STATION:22860} gait Skin: Warm, dry without rashes, lesions, ecchymosis.  Neuro: Cranial nerves intact. No cerebellar symptoms.  Psych: Awake and oriented X 3, normal affect, Insight and Judgment appropriate.    Izora Ribas, NP 8:30 AM [facility]

## 2017-09-22 ENCOUNTER — Ambulatory Visit: Payer: Self-pay | Admitting: Adult Health

## 2017-10-04 NOTE — Progress Notes (Signed)
FOLLOW UP  Assessment and Plan:    Attention deficit disorder, unspecified hyperactivity presence Advised to take vyvanse immediately prior to shift rather than taking first thing in AM to help last through shift; she will try this and report back with progress. If not improving, will try slow taper up on dose, try 50 mg next. She is taking drug breaks and not taking on days that she does not work.  Helps with focus, no AE's. The patient was counseled on the addictive nature of the medication and was encouraged to take drug holidays when not needed.   Adjustment disorder with mixed anxiety and depressed mood Symptoms improved with stress management techniques, has done well off of daily agent.  Uses xanax rarely Stress management techniques discussed, increase water, good sleep hygiene discussed, increase exercise, and increase veggies.   Smoker Smoking cessation-  instruction/counseling given, counseled patient on the dangers of tobacco use, advised patient to stop smoking, and reviewed strategies to maximize success, patient not ready to quit at this time.   Pneumonia of left lower lobe due to infectious organism [facility]) Encouraged her to present to follow up CXR after visit today  Continue diet and meds as discussed. Further disposition pending results of labs. Discussed med's effects and SE's.   Over 30 minutes of exam, counseling, chart review, and critical decision making was performed.   No future appointments.  ----------------------------------------------------------------------------------------------------------------------  HPI 27 y.o. female  presents for 3 month follow up on depression/anxiety due to adjustment disorder, ADD, and follow up for LLL pneumonia.   She was seen on 09/08/2017 for productive cough with nocturnal fevers and was noted to have a LLL pneumonia by XR; she was treated with prednisone and levaquin as she had failed zpak.  She presents feeling  significantly improved, with only mild intermittent dry cough when out in hot/humid weather which she attributes more to hx of asthma.   she currently continues to smoke 0.5-1 pack a day; discussed risks associated with smoking, patient is not ready to quit.   she has a diagnosis of depression/anxiety and is currently on PRN xanax only, has been on several daily agents but feels is doing well off of these at this time, reports symptoms are well controlled on current regimen. she currently uses xanax rarely, last several months ago.   Patient is on an ADD medication, vyvanse 40 mg daily, she states that the medication is helping, but seems to run out about noon and she denies any adverse reactions.   Today their BP is BP: 104/64  She does not workout. She denies chest pain, shortness of breath, dizziness.    Current Medications:  Current Outpatient Medications on File Prior to Visit  Medication Sig  . ALPRAZolam (XANAX) 0.5 MG tablet 1/2-1 tablet as needed daily for anxiety  . Levocetirizine Dihydrochloride (XYZAL PO) Take by mouth.  . lisdexamfetamine (VYVANSE) 40 MG capsule Take 1 tab by mouth as needed on days that you work only.   No current facility-administered medications on file prior to visit.      Allergies: No Known Allergies   Medical History:  Past Medical History:  Diagnosis Date  . 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   Melissa Cantu ADHD (attention deficit hyperactivity disorder)   . Anemia   . Asthma    Family history- Reviewed and unchanged Social history- Reviewed and unchanged   Review of Systems:  Review of Systems  Constitutional: Negative for malaise/fatigue and weight loss.  HENT: Negative for hearing loss and tinnitus.   Eyes: Negative for blurred vision and double vision.  Respiratory: Negative for cough, shortness of breath and wheezing.   Cardiovascular: Negative for chest pain, palpitations,  orthopnea, claudication and leg swelling.  Gastrointestinal: Negative for abdominal pain, blood in stool, constipation, diarrhea, heartburn, melena, nausea and vomiting.  Genitourinary: Negative.   Musculoskeletal: Negative for joint pain and myalgias.  Skin: Negative for rash.  Neurological: Negative for dizziness, tingling, sensory change, weakness and headaches.  Endo/Heme/Allergies: Negative for polydipsia.  Psychiatric/Behavioral: Negative.   All other systems reviewed and are negative.   Physical Exam: BP 104/64   Pulse 95   Temp 97.7 F (36.5 C)   Ht 5' 4.5" (1.638 m)   Wt 131 lb (59.4 kg)   SpO2 99%   BMI 22.14 kg/m  Wt Readings from Last 3 Encounters:  10/06/17 131 lb (59.4 kg)  09/07/17 130 lb (59 kg)  05/31/17 133 lb (60.3 kg)   General Appearance: Well nourished, in no apparent distress. Eyes: PERRLA, EOMs, conjunctiva no swelling or erythema Sinuses: No Frontal/maxillary tenderness ENT/Mouth: Ext aud canals clear, TMs without erythema, bulging. No erythema, swelling, or exudate on post pharynx.  Tonsils not swollen or erythematous. Hearing normal.  Neck: Supple, thyroid normal.  Respiratory: Respiratory effort normal, BS equal bilaterally without rales, rhonchi, wheezing or stridor.  Cardio: RRR with no MRGs. Brisk peripheral pulses without edema.  Abdomen: Soft, + BS.  Non tender, no guarding, rebound, hernias, masses. Lymphatics: Non tender without lymphadenopathy.  Musculoskeletal: Full ROM, 5/5 strength, Normal gait Skin: Warm, dry without rashes, lesions, ecchymosis.  Neuro: Cranial nerves intact. No cerebellar symptoms.  Psych: Awake and oriented X 3, normal affect, Insight and Judgment appropriate.    Izora Ribas, NP 9:59 AM [facility]

## 2017-10-06 ENCOUNTER — Other Ambulatory Visit (HOSPITAL_COMMUNITY): Payer: Self-pay

## 2017-10-06 ENCOUNTER — Ambulatory Visit (HOSPITAL_COMMUNITY)
Admission: RE | Admit: 2017-10-06 | Discharge: 2017-10-06 | Disposition: A | Discharge status: Home / Self Care | Payer: 59 | Source: Ambulatory Visit | Attending: Adult Health | Admitting: Adult Health

## 2017-10-06 ENCOUNTER — Ambulatory Visit: Payer: 59 | Admitting: Adult Health

## 2017-10-06 ENCOUNTER — Encounter: Payer: Self-pay | Admitting: Adult Health

## 2017-10-06 VITALS — BP 104/64 | HR 95 | Temp 97.7°F | Ht 64.5 in | Wt 131.0 lb

## 2017-10-06 DIAGNOSIS — F988 Other specified behavioral and emotional disorders with onset usually occurring in childhood and adolescence: Secondary | ICD-10-CM

## 2017-10-06 DIAGNOSIS — J181 Lobar pneumonia, unspecified organism: Secondary | ICD-10-CM | POA: Diagnosis not present

## 2017-10-06 DIAGNOSIS — F4323 Adjustment disorder with mixed anxiety and depressed mood: Secondary | ICD-10-CM

## 2017-10-06 DIAGNOSIS — J189 Pneumonia, unspecified organism: Secondary | ICD-10-CM

## 2017-10-06 DIAGNOSIS — F17200 Nicotine dependence, unspecified, uncomplicated: Secondary | ICD-10-CM

## 2017-10-06 NOTE — Patient Instructions (Signed)
Look into apps to help with quitting smoking -       Steps to Quit Smoking Smoking tobacco can be harmful to your health and can affect almost every organ in your body. Smoking puts you, and those around you, at risk for developing many serious chronic diseases. Quitting smoking is difficult, but it is one of the best things that you can do for your health. It is never too late to quit. What are the benefits of quitting smoking? When you quit smoking, you lower your risk of developing serious diseases and conditions, such as:  Lung cancer or lung disease, such as COPD.  Heart disease.  Stroke.  Heart attack.  Infertility.  Osteoporosis and bone fractures.  Additionally, symptoms such as coughing, wheezing, and shortness of breath may get better when you quit. You may also find that you get sick less often because your body is stronger at fighting off colds and infections. If you are pregnant, quitting smoking can help to reduce your chances of having a baby of low birth weight. How do I get ready to quit? When you decide to quit smoking, create a plan to make sure that you are successful. Before you quit:  Pick a date to quit. Set a date within the next two weeks to give you time to prepare.  Write down the reasons why you are quitting. Keep this list in places where you will see it often, such as on your bathroom mirror or in your car or wallet.  Identify the people, places, things, and activities that make you want to smoke (triggers) and avoid them. Make sure to take these actions: ? Throw away all cigarettes at home, at work, and in your car. ? Throw away smoking accessories, such as Scientist, research (medical). ? Clean your car and make sure to empty the ashtray. ? Clean your home, including curtains and carpets.  Tell your family, friends, and coworkers that you are quitting. Support from your loved ones can make quitting easier.  Talk with your health care provider  about your options for quitting smoking.  Find out what treatment options are covered by your health insurance.  What strategies can I use to quit smoking? Talk with your healthcare provider about different strategies to quit smoking. Some strategies include:  Quitting smoking altogether instead of gradually lessening how much you smoke over a period of time. Research shows that quitting "cold [place]" is more successful than gradually quitting.  Attending in-person counseling to help you build problem-solving skills. You are more likely to have success in quitting if you attend several counseling sessions. Even short sessions of 10 minutes can be effective.  Finding resources and support systems that can help you to quit smoking and remain smoke-free after you quit. These resources are most helpful when you use them often. They can include: ? Online chats with a Social worker. ? Telephone quitlines. ? Careers information officer. ? Support groups or group counseling. ? Text messaging programs. ? Mobile phone applications.  Taking medicines to help you quit smoking. (If you are pregnant or breastfeeding, talk with your health care provider first.) Some medicines contain nicotine and some do not. Both types of medicines help with cravings, but the medicines that include nicotine help to relieve withdrawal symptoms. Your health care provider may recommend: ? Nicotine patches, gum, or lozenges. ? Nicotine inhalers or sprays. ? Non-nicotine medicine that is taken by mouth.  Talk with your health care provider  about combining strategies, such as taking medicines while you are also receiving in-person counseling. Using these two strategies together makes you more likely to succeed in quitting than if you used either strategy on its own. If you are pregnant or breastfeeding, talk with your health care provider about finding counseling or other support strategies to quit smoking. Do not take medicine to  help you quit smoking unless told to do so by your health care provider. What things can I do to make it easier to quit? Quitting smoking might feel overwhelming at first, but there is a lot that you can do to make it easier. Take these important actions:  Reach out to your family and friends and ask that they support and encourage you during this time. Call telephone quitlines, reach out to support groups, or work with a counselor for support.  Ask people who smoke to avoid smoking around you.  Avoid places that trigger you to smoke, such as bars, parties, or smoke-break areas at work.  Spend time around people who do not smoke.  Lessen stress in your life, because stress can be a smoking trigger for some people. To lessen stress, try: ? Exercising regularly. ? Deep-breathing exercises. ? Yoga. ? Meditating. ? Performing a body scan. This involves closing your eyes, scanning your body from head to toe, and noticing which parts of your body are particularly tense. Purposefully relax the muscles in those areas.  Download or purchase mobile phone or tablet apps (applications) that can help you stick to your quit plan by providing reminders, tips, and encouragement. There are many free apps, such as QuitGuide from the [employer] Office manager for Disease Control and Prevention). You can find other support for quitting smoking (smoking cessation) through smokefree.gov and other websites.  How will I feel when I quit smoking? Within the first 24 hours of quitting smoking, you may start to feel some withdrawal symptoms. These symptoms are usually most noticeable 2-3 days after quitting, but they usually do not last beyond 2-3 weeks. Changes or symptoms that you might experience include:  Mood swings.  Restlessness, anxiety, or irritation.  Difficulty concentrating.  Dizziness.  Strong cravings for sugary foods in addition to nicotine.  Mild weight gain.  Constipation.  Nausea.  Coughing or a  sore throat.  Changes in how your medicines work in your body.  A depressed mood.  Difficulty sleeping (insomnia).  After the first 2-3 weeks of quitting, you may start to notice more positive results, such as:  Improved sense of smell and taste.  Decreased coughing and sore throat.  Slower heart rate.  Lower blood pressure.  Clearer skin.  The ability to breathe more easily.  Fewer sick days.  Quitting smoking is very challenging for most people. Do not get discouraged if you are not successful the first time. Some people need to make many attempts to quit before they achieve long-term success. Do your best to stick to your quit plan, and talk with your health care provider if you have any questions or concerns. This information is not intended to replace advice given to you by your health care provider. Make sure you discuss any questions you have with your health care provider. Document Released: 01/06/2001 Document Revised: 09/10/2015 Document Reviewed: 05/29/2014 Elsevier Interactive Patient Education  [employer].

## 2017-11-04 ENCOUNTER — Encounter: Payer: Self-pay | Admitting: Adult Health

## 2017-11-04 ENCOUNTER — Ambulatory Visit: Payer: 59 | Admitting: Adult Health

## 2017-11-04 VITALS — BP 94/58 | HR 83 | Temp 97.5°F | Ht 64.5 in | Wt 128.0 lb

## 2017-11-04 DIAGNOSIS — J0100 Acute maxillary sinusitis, unspecified: Secondary | ICD-10-CM

## 2017-11-04 MED ORDER — AMOXICILLIN-POT CLAVULANATE 875-125 MG PO TABS: 1.0000 | ORAL_TABLET | Freq: Two times a day (BID) | ORAL | 0 refills | Status: DC

## 2017-11-04 MED ORDER — PREDNISONE 20 MG PO TABS: ORAL_TABLET | ORAL | 0 refills | Status: DC

## 2017-11-04 NOTE — Progress Notes (Signed)
Assessment and Plan:  Melissa Cantu was seen today for uri.  Diagnoses and all orders for this visit:  Acute maxillary sinusitis, recurrence not specified Suggested symptomatic OTC remedies- robitussin, mucinex, humidifier Nasal saline spray for congestion. Nasal steroids, allergy pill, oral steroids offered Follow up as needed, ER for severe symptoms, dyspnea STOP SMOKING, DRINK MORE WATER, IMPROVE LIFESTYLE -     amoxicillin-clavulanate (AUGMENTIN) 875-125 MG tablet; Take 1 tablet by mouth 2 (two) times daily for 10 days. -     predniSONE (DELTASONE) 20 MG tablet; 2 tablets daily for 3 days, 1 tablet daily for 4 days.  Further disposition pending results of labs. Discussed med's effects and SE's.   Over 15 minutes of exam, counseling, chart review, and critical decision making was performed.   No future appointments.  ------------------------------------------------------------------------------------------------------------------   HPI BP (!) 94/58   Pulse 83   Temp (!) 97.5 F (36.4 C)   Ht 5' 4.5" (1.638 m)   Wt 128 lb (58.1 kg)   SpO2 98%   BMI 21.63 kg/m   27 y.o.female presents for evaluation of facial pressure, nasal congestion, with clear nasal discharge, upper jaw aching on right, new onset non-productive cough that began yesterday.   Recent history of LLL pneumonia, still smoking but stopped when started feeling poorly, denies chest pain, dyspnea. Resolution was confirmed by CXR. Does endorse some fatigue.   Has hx of allegies and takes xyzal year round, has nasal steroids, does nasal irrigation  Just saw urgent care yesterday and was diagnosed with sinusitis and prescribed amoxicillin but feeling worse even after starting abx, had fever yesterday 99+. Hasn't taken NSAID/tylenol. Feeling feverish/chills this AM.   Admits to very poor lifestyle, doesn't drink water, poor diet, drinks soda  Past Medical History:  Diagnosis Date  . Abnormal Pap smear 2011 HR HPV,  07/23/2011 CIN 1, +HPV/ LGSIL, 10/20/2011 LGSIL, + HPV / CIN 1.   Marland Kitchen ADHD (attention deficit hyperactivity disorder)   . Anemia   . Asthma      No Known Allergies  Current Outpatient Medications on File Prior to Visit  Medication Sig  . ALPRAZolam (XANAX) 0.5 MG tablet 1/2-1 tablet as needed daily for anxiety  . Levocetirizine Dihydrochloride (XYZAL PO) Take by mouth.  . lisdexamfetamine (VYVANSE) 40 MG capsule Take 1 tab by mouth as needed on days that you work only.   No current facility-administered medications on file prior to visit.     ROS: all negative except above.   Physical Exam:  BP (!) 94/58   Pulse 83   Temp (!) 97.5 F (36.4 C)   Ht 5' 4.5" (1.638 m)   Wt 128 lb (58.1 kg)   SpO2 98%   BMI 21.63 kg/m   General Appearance: Well nourished, in no acute distress, face flushed Eyes: PERRLA, EOMs, conjunctiva no swelling or erythema Sinuses: No Frontal tenderness, does have R maxillary tenderness,  ENT/Mouth: Ext aud canals clear, TMs without erythema, bulging. No erythema, swelling, or exudate on post pharynx.  Tonsils absent. Hearing normal.  Neck: Supple, thyroid normal.  Respiratory: Respiratory effort normal, BS equal bilaterally without rales, rhonchi, wheezing or stridor.  Cardio: RRR with no MRGs. Brisk peripheral pulses without edema.  Abdomen: Soft, + BS.  Non tender. Lymphatics: upper anterior neck over R eustachian tube tender without palpable lymphadenopathy.  Musculoskeletal: normal gait.  Skin: Warm, dry without rashes, lesions, ecchymosis.  Neuro: Cranial nerves intact. Normal muscle tone. Psych: Awake and oriented X 3, normal affect, Insight  and Judgment appropriate.     Izora Ribas, NP 4:09 PM [facility]

## 2017-11-04 NOTE — Patient Instructions (Signed)

## 2017-11-08 ENCOUNTER — Other Ambulatory Visit: Payer: Self-pay | Admitting: Adult Health

## 2017-11-08 DIAGNOSIS — J0100 Acute maxillary sinusitis, unspecified: Secondary | ICD-10-CM

## 2017-11-08 MED ORDER — PROMETHAZINE-DM 6.25-15 MG/5ML PO SYRP: 5.0000 mL | ORAL_SOLUTION | Freq: Four times a day (QID) | ORAL | 1 refills | Status: DC | PRN

## 2017-11-08 MED ORDER — AZITHROMYCIN 250 MG PO TABS: ORAL_TABLET | ORAL | 1 refills | Status: AC

## 2017-11-08 MED ORDER — PREDNISONE 20 MG PO TABS: ORAL_TABLET | ORAL | 0 refills | Status: DC

## 2017-12-16 ENCOUNTER — Encounter: Payer: Self-pay | Admitting: Adult Health

## 2017-12-16 ENCOUNTER — Ambulatory Visit: Payer: 59 | Admitting: Adult Health

## 2017-12-16 VITALS — BP 112/64 | HR 89 | Temp 99.1°F | Ht 64.5 in | Wt 136.4 lb

## 2017-12-16 DIAGNOSIS — N898 Other specified noninflammatory disorders of vagina: Secondary | ICD-10-CM | POA: Diagnosis not present

## 2017-12-16 DIAGNOSIS — F17200 Nicotine dependence, unspecified, uncomplicated: Secondary | ICD-10-CM

## 2017-12-16 DIAGNOSIS — F988 Other specified behavioral and emotional disorders with onset usually occurring in childhood and adolescence: Secondary | ICD-10-CM | POA: Diagnosis not present

## 2017-12-16 DIAGNOSIS — Z1509 Genetic susceptibility to other malignant neoplasm: Secondary | ICD-10-CM | POA: Insufficient documentation

## 2017-12-16 DIAGNOSIS — Z7251 High risk heterosexual behavior: Secondary | ICD-10-CM

## 2017-12-16 MED ORDER — LISDEXAMFETAMINE DIMESYLATE 50 MG PO CAPS: ORAL_CAPSULE | ORAL | 0 refills | Status: DC

## 2017-12-16 NOTE — Patient Instructions (Signed)
Bacterial Vaginosis Bacterial vaginosis is a vaginal infection that occurs when the normal balance of bacteria in the vagina is disrupted. It results from an overgrowth of certain bacteria. This is the most common vaginal infection among women ages 15-44. Because bacterial vaginosis increases your risk for STIs (sexually transmitted infections), getting treated can help reduce your risk for chlamydia, gonorrhea, herpes, and HIV (human immunodeficiency virus). Treatment is also important for preventing complications in pregnant women, because this condition can cause an early (premature) delivery. What are the causes? This condition is caused by an increase in harmful bacteria that are normally present in small amounts in the vagina. However, the reason that the condition develops is not fully understood. What increases the risk? The following factors may make you more likely to develop this condition:  Having a new sexual partner or multiple sexual partners.  Having unprotected sex.  Douching.  Having an intrauterine device (IUD).  Smoking.  Drug and alcohol abuse.  Taking certain antibiotic medicines.  Being pregnant.  You cannot get bacterial vaginosis from toilet seats, bedding, swimming pools, or contact with objects around you. What are the signs or symptoms? Symptoms of this condition include:  Grey or white vaginal discharge. The discharge can also be watery or foamy.  A fish-like odor with discharge, especially after sexual intercourse or during menstruation.  Itching in and around the vagina.  Burning or pain with urination.  Some women with bacterial vaginosis have no signs or symptoms. How is this diagnosed? This condition is diagnosed based on:  Your medical history.  A physical exam of the vagina.  Testing a sample of vaginal fluid under a microscope to look for a large amount of bad bacteria or abnormal cells. Your health care provider may use a cotton swab  or a small wooden spatula to collect the sample.  How is this treated? This condition is treated with antibiotics. These may be given as a pill, a vaginal cream, or a medicine that is put into the vagina (suppository). If the condition comes back after treatment, a second round of antibiotics may be needed. Follow these instructions at home: Medicines  Take over-the-counter and prescription medicines only as told by your health care provider.  Take or use your antibiotic as told by your health care provider. Do not stop taking or using the antibiotic even if you start to feel better. General instructions  If you have a female sexual partner, tell her that you have a vaginal infection. She should see her health care provider and be treated if she has symptoms. If you have a female sexual partner, he does not need treatment.  During treatment: ? Avoid sexual activity until you finish treatment. ? Do not douche. ? Avoid alcohol as directed by your health care provider. ? Avoid breastfeeding as directed by your health care provider.  Drink enough water and fluids to keep your urine clear or pale yellow.  Keep the area around your vagina and rectum clean. ? Wash the area daily with warm water. ? Wipe yourself from front to back after using the toilet.  Keep all follow-up visits as told by your health care provider. This is important. How is this prevented?  Do not douche.  Wash the outside of your vagina with warm water only.  Use protection when having sex. This includes latex condoms and dental dams.  Limit how many sexual partners you have. To help prevent bacterial vaginosis, it is best to have sex with just   one partner (monogamous).  Make sure you and your sexual partner are tested for STIs.  Wear cotton or cotton-lined underwear.  Avoid wearing tight pants and pantyhose, especially during summer.  Limit the amount of alcohol that you drink.  Do not use any products that  contain nicotine or tobacco, such as cigarettes and e-cigarettes. If you need help quitting, ask your health care provider.  Do not use illegal drugs. Where to find more information:  Centers for Disease Control and Prevention: AppraiserFraud.fi  American Sexual Health Association (ASHA): www.ashastd.org  [place] Department of Health and Financial controller, Office on Women's Health: DustingSprays.pl or SecuritiesCard.it Contact a health care provider if:  Your symptoms do not improve, even after treatment.  You have more discharge or pain when urinating.  You have a fever.  You have pain in your abdomen.  You have pain during sex.  You have vaginal bleeding between periods. Summary  Bacterial vaginosis is a vaginal infection that occurs when the normal balance of bacteria in the vagina is disrupted.  Because bacterial vaginosis increases your risk for STIs (sexually transmitted infections), getting treated can help reduce your risk for chlamydia, gonorrhea, herpes, and HIV (human immunodeficiency virus). Treatment is also important for preventing complications in pregnant women, because the condition can cause an early (premature) delivery.  This condition is treated with antibiotic medicines. These may be given as a pill, a vaginal cream, or a medicine that is put into the vagina (suppository). This information is not intended to replace advice given to you by your health care provider. Make sure you discuss any questions you have with your health care provider. Document Released: 01/12/2005 Document Revised: 05/18/2016 Document Reviewed: 09/28/2015 Elsevier Interactive Patient Education  2018 [employer].     Vaginal Yeast infection, Adult Vaginal yeast infection is a condition that causes soreness, swelling, and redness (inflammation) of the vagina. It also causes vaginal discharge. This is a common condition. Some women get this  infection frequently. What are the causes? This condition is caused by a change in the normal balance of the yeast (candida) and bacteria that live in the vagina. This change causes an overgrowth of yeast, which causes the inflammation. What increases the risk? This condition is more likely to develop in:  Women who take antibiotic medicines.  Women who have diabetes.  Women who take birth control pills.  Women who are pregnant.  Women who douche often.  Women who have a weak defense (immune) system.  Women who have been taking steroid medicines for a long time.  Women who frequently wear tight clothing.  What are the signs or symptoms? Symptoms of this condition include:  White, thick vaginal discharge.  Swelling, itching, redness, and irritation of the vagina. The lips of the vagina (vulva) may be affected as well.  Pain or a burning feeling while urinating.  Pain during sex.  How is this diagnosed? This condition is diagnosed with a medical history and physical exam. This will include a pelvic exam. Your health care provider will examine a sample of your vaginal discharge under a microscope. Your health care provider may send this sample for testing to confirm the diagnosis. How is this treated? This condition is treated with medicine. Medicines may be over-the-counter or prescription. You may be told to use one or more of the following:  Medicine that is taken orally.  Medicine that is applied as a cream.  Medicine that is inserted directly into the vagina (suppository).  Follow these  instructions at home:  Take or apply over-the-counter and prescription medicines only as told by your health care provider.  Do not have sex until your health care provider has approved. Tell your sex partner that you have a yeast infection. That person should go to his or her health care provider if he or she develops symptoms.  Do not wear tight clothes, such as pantyhose or tight  pants.  Avoid using tampons until your health care provider approves.  Eat more yogurt. This may help to keep your yeast infection from returning.  Try taking a sitz bath to help with discomfort. This is a warm water bath that is taken while you are sitting down. The water should only come up to your hips and should cover your buttocks. Do this 3-4 times per day or as told by your health care provider.  Do not douche.  Wear breathable, cotton underwear.  If you have diabetes, keep your blood sugar levels under control. Contact a health care provider if:  You have a fever.  Your symptoms go away and then return.  Your symptoms do not get better with treatment.  Your symptoms get worse.  You have new symptoms.  You develop blisters in or around your vagina.  You have blood coming from your vagina and it is not your menstrual period.  You develop pain in your abdomen. This information is not intended to replace advice given to you by your health care provider. Make sure you discuss any questions you have with your health care provider. Document Released: 10/22/2004 Document Revised: 06/26/2015 Document Reviewed: 07/16/2014 Elsevier Interactive Patient Education  2018 [employer].   Probiotics What are probiotics? Probiotics are the good bacteria and yeasts that live in your body and keep you and your digestive system healthy. Probiotics also help your body's defense (immune) system and protect your body against bad bacterial growth. Certain foods contain probiotics, such as yogurt. Probiotics can also be purchased as a supplement. As with any supplement or drug, it is important to discuss its use with your health care provider. What affects the balance of bacteria in my body? The balance of bacteria in your body can be affected by:  Antibiotic medicines. Antibiotics are sometimes necessary to treat infection. Unfortunately, they may kill good or friendly bacteria in your body  as well as the bad bacteria. This may lead to stomach problems like diarrhea, gas, and cramping.  Disease. Some conditions are the result of an overgrowth of bad bacteria, yeasts, parasites, or fungi. These conditions include: ? Infectious diarrhea. ? Stomach and respiratory infections. ? Skin infections. ? Irritable bowel syndrome (IBS). ? Inflammatory bowel diseases. ? Ulcer due to Helicobacter pylori (H. pylori) infection. ? Tooth decay and periodontal disease. ? Vaginal infections.  Stress and poor diet may also lower the good bacteria in your body. What type of probiotic is right for me? Probiotics are available over the counter at your local pharmacy, health food, or grocery store. They come in many different forms, combinations of strains, and dosing strengths. Some may need to be refrigerated. Always read the label for storage and usage instructions. Specific strains have been shown to be more effective for certain conditions. Ask your health care provider what option is best for you. Why would I need probiotics? There are many reasons your health care provider might recommend a probiotic supplement, including:  Diarrhea.  Constipation.  IBS.  Respiratory infections.  Yeast infections.  Acne, eczema, and other skin conditions.  Frequent urinary tract infections (UTIs).  Are there side effects of probiotics? Some people experience mild side effects when taking probiotics. Side effects are usually temporary and may include:  Gas.  Bloating.  Cramping.  Rarely, serious side effects, such as infection or immune system changes, may occur. What else do I need to know about probiotics?  There are many different strains of probiotics. Certain strains may be more effective depending on your condition. Probiotics are available in varying doses. Ask your health care provider which probiotic you should use and how often.  If you are taking probiotics along with antibiotics,  it is generally recommended to wait at least 2 hours between taking the antibiotic and taking the probiotic. For more information: [facility] LocalChronicle.com.cy This information is not intended to replace advice given to you by your health care provider. Make sure you discuss any questions you have with your health care provider. Document Released: 08/09/2013 Document Revised: 12/10/2015 Document Reviewed: 04/11/2013 Elsevier Interactive Patient Education  2017 [employer].

## 2017-12-16 NOTE — Progress Notes (Signed)
Assessment and Plan:  Domnique was seen today for vaginal discharge.  Diagnoses and all orders for this visit:  Vaginal discharge Sent off STD labs, wet prep.  Treatment: abstain from coitus during course of treatment ROV prn if symptoms persist or worsen. -     C. trachomatis/N. gonorrhoeae RNA -     WET PREP BY MOLECULAR PROBE  Lynch syndrome She is followed closely by GYN, reports has initiated colonoscopy monitoring Continue to monitor closely for possible cancers due to high risk  Attention deficit disorder, unspecified hyperactivity presence Helps with focus, no AE's. Will try higher dose x 30 days and evaluate response.  The patient was counseled on the addictive nature of the medication and was encouraged to take drug holidays when not needed.  -     lisdexamfetamine (VYVANSE) 50 MG capsule; Take 1 tab by mouth as needed on days that you work only. Script should last longer than 1 month.  Smoker Discussed risks associated with tobacco use and advised to reduce or quit Patient is not ready to do so, but advised to consider strongly Will follow up at the next visit  Further disposition pending results of labs. Discussed med's effects and SE's.   Over 30 minutes of exam, counseling, chart review, and critical decision making was performed.   No future appointments.  ------------------------------------------------------------------------------------------------------------------   HPI BP 112/64   Pulse 89   Temp 99.1 F (37.3 C)   Ht 5' 4.5" (1.638 m)   Wt 136 lb 6.4 oz (61.9 kg)   LMP 11/26/2017   SpO2 98%   BMI 23.05 kg/m   27 y.o.female presents for evaluation of foul smelling vaginal discharge; she reports this is ongoing for several months. She reports ongoing discharge is variable, brown/yellow/grey variably. She does report intermittent discomfort with sexual intercourse, though not localized, seems to move around. She denies nausea, vomiting, abdominal  pain, dysuria or other urinary changes, rashes, vaginal itching, fever/chills.   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 She also reports today hx of Lynch syndrome.  She has fiance, here with her today, both have reportedly been fully screened for STDs last year and were negative, report have been monogamous, but requesting repeat screening today.   Patient is on an ADD medication, currently taking vyvanse 40 mg daily on days that she works, she states that the medication is helping and she denies any adverse reactions, but does report that medication runs out prior to end of shift; we have discussed possible trial of increased dose to 50 mg previously and she would like to try this.   she currently continues to smoke 1 pack a day, she does not vape; discussed risks associated with smoking, patient is not ready to quit.     Past Medical History:  Diagnosis Date  . 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   Marland Kitchen ADHD (attention deficit hyperactivity disorder)   . Anemia   . Asthma      No Known Allergies  Current Outpatient Medications on File Prior to Visit  Medication Sig  . ALPRAZolam (XANAX) 0.5 MG tablet 1/2-1 tablet as needed daily for anxiety  . Levocetirizine Dihydrochloride (XYZAL PO) Take by mouth.  . lisdexamfetamine (VYVANSE) 40 MG capsule Take 1 tab by mouth as needed on days  that you work only.   No current facility-administered medications on file prior to visit.     ROS: all negative except above.   Physical Exam:  BP 112/64   Pulse 89   Temp 99.1 F (37.3 C)   Ht 5' 4.5" (1.638 m)   Wt 136 lb 6.4 oz (61.9 kg)   LMP 11/26/2017   SpO2 98%   BMI 23.05 kg/m   General Appearance: Well nourished, in no apparent distress. Eyes: PERRLA, conjunctiva no swelling or erythema ENT/Mouth: Ext  aud canals clear, TMs without erythema, bulging. No erythema, swelling, or exudate on post pharynx.  Tonsils absent. Hearing normal.  Neck: Supple, thyroid normal.  Respiratory: Respiratory effort normal, BS equal bilaterally without rales, rhonchi, wheezing or stridor.  Cardio: RRR with no MRGs. Brisk peripheral pulses without edema.  Abdomen: Soft, + BS.  Non tender, no guarding, rebound, hernias, masses. Lymphatics: Non tender without lymphadenopathy.  Musculoskeletal: normal gait.  Skin: Warm, dry without rashes, lesions, ecchymosis.  Psych: Awake and oriented X 3, normal affect, Insight and Judgment appropriate.  Female genitalia:  Vulva: normal appearing vulva with no masses, tenderness or lesions Vagina: normal appearing vagina with normal color and discharge, no lesions Cervix: cervical discharge present - white, malodorous and moderate in quantity, cervical motion tenderness absent and lesions absent Uterus: uterus is normal size, shape, consistency and nontender Adnexa: non-palpable, but with tenderness on right  Izora Ribas, NP 4:21 PM [facility]

## 2017-12-17 ENCOUNTER — Other Ambulatory Visit: Payer: Self-pay | Admitting: Adult Health

## 2017-12-17 LAB — WET PREP BY MOLECULAR PROBE
Candida species: NOT DETECTED
MICRO NUMBER: 91407170
SOURCE:: 0
SPECIMEN QUALITY: ADEQUATE
Trichomonas vaginosis: NOT DETECTED

## 2017-12-17 LAB — C. TRACHOMATIS/N. GONORRHOEAE RNA
C. trachomatis RNA, TMA: NOT DETECTED
N. GONORRHOEAE RNA, TMA: NOT DETECTED

## 2017-12-17 MED ORDER — METRONIDAZOLE 0.75 % VA GEL: 1.0000 | Freq: Every day | VAGINAL | 0 refills | Status: DC

## 2017-12-20 ENCOUNTER — Other Ambulatory Visit: Payer: Self-pay | Admitting: Adult Health

## 2017-12-20 DIAGNOSIS — F988 Other specified behavioral and emotional disorders with onset usually occurring in childhood and adolescence: Secondary | ICD-10-CM

## 2017-12-20 MED ORDER — LISDEXAMFETAMINE DIMESYLATE 50 MG PO CAPS: ORAL_CAPSULE | ORAL | 0 refills | Status: DC

## 2018-01-06 ENCOUNTER — Other Ambulatory Visit: Payer: Self-pay | Admitting: Adult Health

## 2018-01-06 MED ORDER — AZITHROMYCIN 250 MG PO TABS: ORAL_TABLET | ORAL | 1 refills | Status: AC

## 2018-01-06 MED ORDER — PREDNISONE 20 MG PO TABS: ORAL_TABLET | ORAL | 0 refills | Status: DC

## 2018-02-14 ENCOUNTER — Other Ambulatory Visit: Payer: Self-pay | Admitting: Adult Health

## 2018-02-14 ENCOUNTER — Telehealth: Payer: Self-pay

## 2018-02-14 MED ORDER — OSELTAMIVIR PHOSPHATE 75 MG PO CAPS: 75.0000 mg | ORAL_CAPSULE | Freq: Every day | ORAL | 0 refills | Status: AC

## 2018-02-14 NOTE — Telephone Encounter (Signed)
Called to check status of request.

## 2018-02-14 NOTE — Telephone Encounter (Signed)
Son was just diagnosed with the flu. She went to urgent care over the weekend and tested negative. Her symptoms are the same as his. Would like to know what she can do.

## 2018-02-15 ENCOUNTER — Encounter: Payer: Self-pay | Admitting: Internal Medicine

## 2018-02-15 ENCOUNTER — Ambulatory Visit: Payer: 59 | Admitting: Internal Medicine

## 2018-02-15 VITALS — BP 102/68 | HR 75 | Temp 98.1°F | Ht 64.5 in | Wt 131.0 lb

## 2018-02-15 DIAGNOSIS — B349 Viral infection, unspecified: Secondary | ICD-10-CM

## 2018-02-15 MED ORDER — TRAMADOL HCL 50 MG PO TABS: ORAL_TABLET | ORAL | 0 refills | Status: DC

## 2018-02-15 MED ORDER — PREDNISONE 20 MG PO TABS: ORAL_TABLET | ORAL | 0 refills | Status: DC

## 2018-02-15 NOTE — Progress Notes (Signed)
   Subjective:    Patient ID: Melissa Cantu, female    DOB: [birth year], 28 y.o.   MRN: [id number]  HPI     This very nice 28 yo single WF mother of 4 children presents with HA, generalized body aches, non productive cough and sorethroat. She does report intermittent fever to 100 deg. Tennis Must does report one for her 4 children recently tested (+) for flu about 4-5 days ago.   Medication Sig  . ALPRAZolam (XANAX) 0.5 MG tablet 1/2-1 tablet as needed daily for anxiety  . Levocetirizine Dihydrochloride (XYZAL PO) Take by mouth.  . lisdexamfetamine (VYVANSE) 50 MG capsule Take 1 tab by mouth as needed on days that you work only. Script should ~4 months.  . metroNIDAZOLE (METROGEL) 0.75 % vaginal gel Place 1 Applicatorful vaginally at bedtime. 1 applicator at night for 5 days  . oseltamivir (TAMIFLU) 75 MG capsule Take 1 capsule daily for 10 days(Patient not taking: Reported on 02/15/2018)   No Known Allergies   Past Medical History:  Diagnosis Date  . 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   Marland Kitchen ADHD (attention deficit hyperactivity disorder)   . Anemia   . Asthma    Review of Systems    10 point systems review negative except as above.    Objective:   Physical Exam BP 102/68   Pulse 75   Temp 98.1 F (36.7 C)   Ht 5' 4.5" (1.638 m)   Wt 131 lb (59.4 kg)   SpO2 99%   BMI 22.14 kg/m   In No Distress. No stridor. Speech hoarse. Cough congested.   HEENT - Eac's patent. TM's Nl. EOM's full. PERRLA.  NasoOroPharynx clear. Fronto-maxillary sinuses Neck - supple. Nl Thyroid. Chest - Clear equal BS w/o rales, rhonchi, wheezes. Cor - Nl HS. RRR w/o sig m. No edema. Abd - No palpable organomegaly, masses or tenderness. BS nl. MS- FROM w/o deformities. Muscle power, tone and bulk Nl. Gait Nl. Neuro - No obvious Cr N abnormalities. Sensory, motor and Cerebellar functions appear Nl w/o focal abnormalities. Skin - exposed clear w/o rash  cyanosis or icterus.    Assessment & Plan:   1. Viral illness  - predniSONE (DELTASONE) 20 MG tablet; 1 tab 3 x day for 3 days, then 1 tab 2 x day for 3 days, then 1 tab 1 x day for 5 days  Dispense: 20 tablet; Refill: 0 - traMADol (ULTRAM) 50 MG tablet; Take 1 tablet every 4 hours as needed for pain  Dispense: 20 tablet; Refill: 0  - discussed meds / SE's and encouraged to call if sputum becomes purulent.

## 2018-03-02 NOTE — Progress Notes (Signed)
Complete Physical  Assessment and Plan:  Melissa Cantu was seen today for annual exam.  Diagnoses and all orders for this visit:  Encounter for routine adult health examination without abnormal findings  Smoker Discussed risks associated with tobacco use and advised to reduce or quit Patient is not ready to do so, but advised to consider strongly Will follow up at the next visit -     EKG 12-Lead  Lynch syndrome GYN for PAP, high risk cancer, initiate aggressive screening -     Ambulatory referral to Gastroenterology  LGSIL on Pap smear of cervix Follow up GYN - she will schedule this  Adjustment disorder with mixed anxiety and depressed mood Currently off of medications other than rare xanax and vyvanse for ADD Possible bipolar, recommended psych for full screening, and recommended counselling for home stress She declines any medications today Lifestyle discussed: diet/exerise, sleep hygiene, stress management, hydration -     ALPRAZolam (XANAX) 0.5 MG tablet; 1/2-1 tablet as needed daily for anxiety  Attention deficit disorder, unspecified hyperactivity presence Continue medications  Helps with focus, no AE's. The patient was counseled on the addictive nature of the medication and was encouraged to take drug holidays when not needed.   BMI 22.0-22.9, adult Continue to recommend diet heavy in fruits and veggies and low in animal meats, cheeses, and dairy products, appropriate calorie intake Discuss exercise recommendations routinely Continue to monitor weight at each visit  Medication management -     CBC with Differential/Platelet -     COMPLETE METABOLIC PANEL WITH GFR -     Magnesium -     Urinalysis, Routine w reflex microscopic  Screening for cardiovascular condition -     EKG 12-Lead  Screening cholesterol level -     Lipid panel  Screening for diabetes mellitus -     Hemoglobin A1c  Screening for thyroid disorder -     TSH  Vitamin D deficiency -      VITAMIN D 25 Hydroxy (Vit-D Deficiency, Fractures)  Left lower quadrant abdominal tenderness without rebound tenderness Personal history of ovarian cyst, 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 1 applicator at night for 5 days  Discussed med's effects and SE's. Screening labs and tests as requested with regular follow-up as recommended. Over 40 minutes of exam, counseling, chart review, and complex, high level critical decision making was performed this visit.   No future appointments.   HPI  28 y.o. female  presents for a complete physical and follow up for has LGSIL on Pap smear of cervix; Adjustment disorder with mixed anxiety and depressed mood; ADD (attention deficit disorder); Smoker; and Lynch syndrome on their problem list.   She has 1 child, 33 y/o son from previous marriage, and fiance. She is a Theme park manager.   She had pap smear last year with GYN, followed by Melissa Cantu, always has abnormal paps with LGSIL, has had biopsy. 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 She reports hx of ovarian cyst, has had abdominal bloating, tenderness with radiation to vagina. Concerned about possible recurrent cyst.   She has hx of Lynch syndrome and will require aggressive cancer screening.  She has not initiated colon cancer screening.  Would like to see Melissa Cantu, ? Melissa Cantu.   Patient is on an ADD medication, currently taking vyvanse 50 mg daily on days that she works, she states that the medication is helping and she denies any adverse reactions.   She is under a lot of stress due to disagreements with mom. Has done counseling, wants to speak with psych about possible bipolar screening due to labile  emotions.   she currently continues to smoke 0.75-1 pack a day, depends on stress, she does not vape; discussed risks associated with smoking, patient is not ready to quit.   BMI is Body mass index is 22.13 kg/m., she has not been working on diet and exercise. Wt Readings from Last 3 Encounters:  03/03/18 133 lb (60.3 kg)  02/15/18 131 lb (59.4 kg)  12/16/17 136 lb 6.4 oz (61.9 kg)   Today their BP is BP: 96/60 She does not workout. She denies chest pain, shortness of breath, dizziness.   She has never had lipid or A1C screening.  Last GFR: Lab Results  Component Value Date   GFRNONAA 126 09/07/2017   Patient is not currently on Vitamin D supplement.     Current Medications:  Current Outpatient Medications on File Prior to Visit  Medication Sig Dispense Refill  . Levocetirizine Dihydrochloride (XYZAL PO) Take by mouth.    . lisdexamfetamine (VYVANSE) 50 MG capsule Take 1 tab by mouth as needed on days that you work only. Script should ~4 months. 90 capsule 0   No current facility-administered medications on file prior to visit.    Allergies:  No Known Allergies Medical History:  She has LGSIL on Pap smear of cervix; Adjustment disorder with mixed anxiety and depressed mood; ADD (attention deficit disorder); Smoker; and Lynch syndrome on their problem list. Health Maintenance:   Immunization History  Administered Date(s) Administered  . Influenza Split 01/03/2013    Tetanus:  Pneumovax: Flu vaccine: 2019  LMP: No LMP recorded. Pap: At [facility], ? 2018, will follow up  MGM: n/a  Colonoscopy: will schedule due to Lynch syndrome  EGD:  Last Dental Exam: Melissa Cantu, 2019, goes q12m Last Eye Exam: 2019, wears contacts  Patient Care Team: Melissa Pinto, MD as PCP - General (Internal Medicine)  Surgical History:  She has a past surgical history that includes Tonsillectomy; adnoidectomy; and Hernia repair. Family History:  Herfamily history includes  Breast cancer in her maternal aunt, maternal grandmother, and paternal grandmother; Cancer in her father; Cancer (age of onset: 55) in her sister; Colon cancer in her maternal grandfather and maternal uncle; Colon polyps in her mother; Other in her mother; Parkinson's disease in her paternal grandfather; Skin cancer in her maternal grandfather. Social History:  She reports that she has been smoking cigarettes. She has been smoking about 1.00 pack per day. She has never used smokeless tobacco. She reports current alcohol use. She reports previous drug use.  Review of Systems: Review of Systems  Constitutional: Negative for malaise/fatigue and weight loss.  HENT: Negative for hearing loss and tinnitus.   Eyes: Negative for blurred vision and double vision.  Respiratory: Negative for cough, shortness of breath and wheezing.   Cardiovascular: Negative for chest pain, palpitations, orthopnea, claudication and leg swelling.  Gastrointestinal: Negative for abdominal pain, blood in stool, constipation, diarrhea, heartburn, melena, nausea and vomiting.  Genitourinary: Negative.   Musculoskeletal: Negative for joint pain and myalgias.  Skin: Negative for rash.  Neurological: Negative for dizziness, tingling, sensory change, weakness and headaches.  Endo/Heme/Allergies: Negative for polydipsia.  Psychiatric/Behavioral: Positive for depression (labile mood). Negative for hallucinations, substance abuse and suicidal ideas. The patient is nervous/anxious. The patient does not have insomnia.   All other systems reviewed and are negative.   Physical Exam: Estimated body mass index is 22.13 kg/m as calculated from the following:   Height as of this encounter: 5\' 5"  (1.651 m).   Weight as of this encounter: 133 lb (60.3 kg). BP 96/60   Pulse 84   Temp 97.7 F (36.5 C)   Ht 5\' 5"  (1.651 m)   Wt 133 lb (60.3 kg)   SpO2 98%   BMI 22.13 kg/m  General Appearance: Well nourished, in no apparent distress.   Eyes: PERRLA, EOMs, conjunctiva no swelling or erythema, normal fundi and vessels.  Sinuses: No Frontal/maxillary tenderness  ENT/Mouth: Ext aud canals clear, normal light reflex with TMs without erythema, bulging. Good dentition. No erythema, swelling, or exudate on post pharynx. Tonsils not swollen or erythematous. Hearing normal.  Neck: Supple, thyroid normal. No bruits  Respiratory: Respiratory effort normal, BS equal bilaterally without rales, rhonchi, wheezing or stridor.  Cardio: RRR without murmurs, rubs or gallops. Brisk peripheral pulses without edema.  Chest: symmetric, with normal excursions and percussion.  Breasts: Defer to GYN Abdomen: Soft, mildly bloated, mild generalized tenderness worse in LLQ no guarding, rebound, hernias, distinct  masses, or organomegaly.  Lymphatics: Non tender without lymphadenopathy.  Genitourinary: Defer to GYN Musculoskeletal: Full ROM all peripheral extremities,5/5 strength, and normal gait.  Skin: Warm, dry without rashes, lesions, ecchymosis. Neuro: Cranial nerves intact, reflexes equal bilaterally. Normal muscle tone, no cerebellar symptoms. Sensation intact.  Psych: Awake and oriented X 3, normal affect, Insight and Judgment appropriate.   EKG: WNL, no ST changes  Melissa Cantu Melissa Cantu 10:53 AM [facility]

## 2018-03-03 ENCOUNTER — Encounter: Payer: Self-pay | Admitting: Adult Health

## 2018-03-03 ENCOUNTER — Ambulatory Visit: Payer: 59 | Admitting: Adult Health

## 2018-03-03 VITALS — BP 96/60 | HR 84 | Temp 97.7°F | Ht 65.0 in | Wt 133.0 lb

## 2018-03-03 DIAGNOSIS — Z8742 Personal history of other diseases of the female genital tract: Secondary | ICD-10-CM

## 2018-03-03 DIAGNOSIS — Z1211 Encounter for screening for malignant neoplasm of colon: Secondary | ICD-10-CM

## 2018-03-03 DIAGNOSIS — Z79899 Other long term (current) drug therapy: Secondary | ICD-10-CM

## 2018-03-03 DIAGNOSIS — R8290 Unspecified abnormal findings in urine: Secondary | ICD-10-CM

## 2018-03-03 DIAGNOSIS — R10814 Left lower quadrant abdominal tenderness: Secondary | ICD-10-CM

## 2018-03-03 DIAGNOSIS — Z136 Encounter for screening for cardiovascular disorders: Secondary | ICD-10-CM

## 2018-03-03 DIAGNOSIS — Z23 Encounter for immunization: Secondary | ICD-10-CM

## 2018-03-03 DIAGNOSIS — F988 Other specified behavioral and emotional disorders with onset usually occurring in childhood and adolescence: Secondary | ICD-10-CM

## 2018-03-03 DIAGNOSIS — F17200 Nicotine dependence, unspecified, uncomplicated: Secondary | ICD-10-CM

## 2018-03-03 DIAGNOSIS — F330 Major depressive disorder, recurrent, mild: Secondary | ICD-10-CM

## 2018-03-03 DIAGNOSIS — Z1509 Genetic susceptibility to other malignant neoplasm: Secondary | ICD-10-CM

## 2018-03-03 DIAGNOSIS — Z1329 Encounter for screening for other suspected endocrine disorder: Secondary | ICD-10-CM

## 2018-03-03 DIAGNOSIS — Z1389 Encounter for screening for other disorder: Secondary | ICD-10-CM

## 2018-03-03 DIAGNOSIS — Z15068 Genetic susceptibility to other malignant neoplasm of digestive system: Secondary | ICD-10-CM

## 2018-03-03 DIAGNOSIS — Z131 Encounter for screening for diabetes mellitus: Secondary | ICD-10-CM

## 2018-03-03 DIAGNOSIS — R87612 Low grade squamous intraepithelial lesion on cytologic smear of cervix (LGSIL): Secondary | ICD-10-CM

## 2018-03-03 DIAGNOSIS — I10 Essential (primary) hypertension: Secondary | ICD-10-CM

## 2018-03-03 DIAGNOSIS — E559 Vitamin D deficiency, unspecified: Secondary | ICD-10-CM

## 2018-03-03 DIAGNOSIS — F4323 Adjustment disorder with mixed anxiety and depressed mood: Secondary | ICD-10-CM

## 2018-03-03 DIAGNOSIS — Z6822 Body mass index (BMI) 22.0-22.9, adult: Secondary | ICD-10-CM

## 2018-03-03 DIAGNOSIS — Z0000 Encounter for general adult medical examination without abnormal findings: Secondary | ICD-10-CM

## 2018-03-03 DIAGNOSIS — Z13220 Encounter for screening for lipoid disorders: Secondary | ICD-10-CM

## 2018-03-03 MED ORDER — ALPRAZOLAM 0.5 MG PO TABS: ORAL_TABLET | ORAL | 0 refills | Status: DC

## 2018-03-03 MED ORDER — METRONIDAZOLE 0.75 % VA GEL: 1.0000 | Freq: Every day | VAGINAL | 1 refills | Status: DC

## 2018-03-03 NOTE — Addendum Note (Signed)
Addended by: Chancy Hurter on: 03/03/2018 11:56 AM   Modules accepted: Orders

## 2018-03-03 NOTE — Patient Instructions (Addendum)
Look up local psych (call insurance) and see about getting evaluated -   Schedule PAP smear ASAP  Call back with GI provider preference     Melissa Cantu , Thank you for taking time to come for your Annual Wellness Visit. I appreciate your ongoing commitment to your health goals. Please review the following plan we discussed and let me know if I can assist you in the future.   These are the goals we discussed: Goals    . DIET - INCREASE WATER INTAKE     65+ fluid ounces daily     . DIET - REDUCE SUGAR INTAKE     Recommend cutting down on sugar intake  - the American Heart Association recommends no more than 9 teaspoons (38 g) of added sugar for men daily, and 6 teaspoons (25 g) for women. Added sugar can be in many things that you might not expect - salad dressings, bread that is not home made, "all natural" fruit juice, etc., most processed foods contain hidden sugars. Consider looking at labels and being aware of how much sugar you are consuming in a day. Less is always better for sugar; sugar reduces your body's immune response, and damages blood vessels, leading to increased risk of many diseases.      . Quit Smoking       This is a list of the screening recommended for you and due dates:  Health Maintenance  Topic Date Due  . PAP-Cervical Cytology Screening  10/20/2014  . Pap Smear  10/20/2014  . Tetanus Vaccine  01/26/2026  . Flu Shot  Completed  . HIV Screening  Completed     Know what a healthy weight is for you (roughly BMI <25) and aim to maintain this  Aim for 7+ servings of fruits and vegetables daily  65-80+ fluid ounces of water or unsweet tea for healthy kidneys  Limit to max 1 drink of alcohol per day; avoid smoking/tobacco  Limit animal fats in diet for cholesterol and heart health - choose grass fed whenever available  Avoid highly processed foods, and foods high in saturated/trans fats  Aim for low stress - take time to unwind and care for your mental  health  Aim for 150 min of moderate intensity exercise weekly for heart health, and weights twice weekly for bone health  Aim for 7-9 hours of sleep daily     Lynch Syndrome Lynch syndrome, also called hereditary nonpolyposis colorectal cancer (HNPCC), is a condition that increases a person's risk for developing colorectal cancer before age 32. Lynch syndrome can also increase a person's risk for many other types of cancer, including stomach, small intestine, liver, gallbladder, pancreas, urinary tract, skin, and brain cancers. Women with this condition have a higher risk for developing cancer of the ovaries and cancer in the lining of the uterus (endometrium). What are the causes? This condition is caused by a gene mutation that is inherited from one or both parents. A gene mutation is a harmful change in a gene. Not everyone who inherits the genetic mutation develops cancer. What are the signs or symptoms? There are no symptoms of this condition. However, your health care provider may test you for Lynch syndrome if you:  Have colorectal cancer before age 71.  Have family members diagnosed with colorectal, endometrial, or other types of cancer. How is this diagnosed? This condition is diagnosed with:  A review of your family history of cancer.  A blood test to look for the  mutations that cause this condition.  Testing of tumor tissue (biopsy). How is this treated? This condition may be managed with:  Genetic counseling to assess your risk and your options for management.  Regular screening tests for the associated cancers. You may need to have a colonoscopy every 1-2 years starting at an early age.  Daily aspirin therapy.  Preventive surgery to remove sites where cancer can develop, such as the colon, uterus, and ovaries. Follow these instructions at home:   Ask your health care provider about your risks.  Discuss a referral for genetic counseling. Ask about the risks and  benefits of genetic counseling.  Write down any questions you have about your condition.  Follow your plan for cancer screenings as told by your health care provider.  Take over-the-counter and prescription medicines only as told by your health care provider.  Maintain a healthy diet.  Consider joining a support group. This may help you learn to cope with the stress of having Lynch syndrome.  Keep all follow-up visits as told by your health care provider. This is important. Contact a health care provider if:  You develop any new or unusual symptoms.  You develop symptoms of colorectal cancer, such as: ? Blood in the stool. ? Changes in bowel habits. ? Abdominal pain or bloating. ? Unexplained weight loss. Summary  Lynch syndrome is caused by an inherited gene mutation. Not everyone who inherits this mutation will develop cancer.  Genetic counseling and blood testing for Lynch syndrome can identify people with the condition.  Regular cancer screening tests are important in managing this condition. This information is not intended to replace advice given to you by your health care provider. Make sure you discuss any questions you have with your health care provider. Document Released: 09/06/2015 Document Revised: 09/12/2015 Document Reviewed: 09/06/2015 Elsevier Interactive Patient Education  2019 [employer].     Steps to Quit Smoking  Smoking tobacco can be harmful to your health and can affect almost every organ in your body. Smoking puts you, and those around you, at risk for developing many serious chronic diseases. Quitting smoking is difficult, but it is one of the best things that you can do for your health. It is never too late to quit. What are the benefits of quitting smoking? When you quit smoking, you lower your risk of developing serious diseases and conditions, such as:  Lung cancer or lung disease, such as COPD.  Heart disease.  Stroke.  Heart  attack.  Infertility.  Osteoporosis and bone fractures. Additionally, symptoms such as coughing, wheezing, and shortness of breath may get better when you quit. You may also find that you get sick less often because your body is stronger at fighting off colds and infections. If you are pregnant, quitting smoking can help to reduce your chances of having a baby of low birth weight. How do I get ready to quit? When you decide to quit smoking, create a plan to make sure that you are successful. Before you quit:  Pick a date to quit. Set a date within the next two weeks to give you time to prepare.  Write down the reasons why you are quitting. Keep this list in places where you will see it often, such as on your bathroom mirror or in your car or wallet.  Identify the people, places, things, and activities that make you want to smoke (triggers) and avoid them. Make sure to take these actions: ? Throw away all cigarettes  at home, at work, and in your car. ? Throw away smoking accessories, such as Scientist, research (medical). ? Clean your car and make sure to empty the ashtray. ? Clean your home, including curtains and carpets.  Tell your family, friends, and coworkers that you are quitting. Support from your loved ones can make quitting easier.  Talk with your health care provider about your options for quitting smoking.  Find out what treatment options are covered by your health insurance. What strategies can I use to quit smoking? Talk with your healthcare provider about different strategies to quit smoking. Some strategies include:  Quitting smoking altogether instead of gradually lessening how much you smoke over a period of time. Research shows that quitting "cold [place]" is more successful than gradually quitting.  Attending in-person counseling to help you build problem-solving skills. You are more likely to have success in quitting if you attend several counseling sessions. Even short  sessions of 10 minutes can be effective.  Finding resources and support systems that can help you to quit smoking and remain smoke-free after you quit. These resources are most helpful when you use them often. They can include: ? Online chats with a Social worker. ? Telephone quitlines. ? Careers information officer. ? Support groups or group counseling. ? Text messaging programs. ? Mobile phone applications.  Taking medicines to help you quit smoking. (If you are pregnant or breastfeeding, talk with your health care provider first.) Some medicines contain nicotine and some do not. Both types of medicines help with cravings, but the medicines that include nicotine help to relieve withdrawal symptoms. Your health care provider may recommend: ? Nicotine patches, gum, or lozenges. ? Nicotine inhalers or sprays. ? Non-nicotine medicine that is taken by mouth. Talk with your health care provider about combining strategies, such as taking medicines while you are also receiving in-person counseling. Using these two strategies together makes you more likely to succeed in quitting than if you used either strategy on its own. If you are pregnant or breastfeeding, talk with your health care provider about finding counseling or other support strategies to quit smoking. Do not take medicine to help you quit smoking unless told to do so by your health care provider. What things can I do to make it easier to quit? Quitting smoking might feel overwhelming at first, but there is a lot that you can do to make it easier. Take these important actions:  Reach out to your family and friends and ask that they support and encourage you during this time. Call telephone quitlines, reach out to support groups, or work with a counselor for support.  Ask people who smoke to avoid smoking around you.  Avoid places that trigger you to smoke, such as bars, parties, or smoke-break areas at work.  Spend time around people who do  not smoke.  Lessen stress in your life, because stress can be a smoking trigger for some people. To lessen stress, try: ? Exercising regularly. ? Deep-breathing exercises. ? Yoga. ? Meditating. ? Performing a body scan. This involves closing your eyes, scanning your body from head to toe, and noticing which parts of your body are particularly tense. Purposefully relax the muscles in those areas.  Download or purchase mobile phone or tablet apps (applications) that can help you stick to your quit plan by providing reminders, tips, and encouragement. There are many free apps, such as QuitGuide from the [employer] Office manager for Disease Control and Prevention). You can find other support for  quitting smoking (smoking cessation) through smokefree.gov and other websites. How will I feel when I quit smoking? Within the first 24 hours of quitting smoking, you may start to feel some withdrawal symptoms. These symptoms are usually most noticeable 2-3 days after quitting, but they usually do not last beyond 2-3 weeks. Changes or symptoms that you might experience include:  Mood swings.  Restlessness, anxiety, or irritation.  Difficulty concentrating.  Dizziness.  Strong cravings for sugary foods in addition to nicotine.  Mild weight gain.  Constipation.  Nausea.  Coughing or a sore throat.  Changes in how your medicines work in your body.  A depressed mood.  Difficulty sleeping (insomnia). After the first 2-3 weeks of quitting, you may start to notice more positive results, such as:  Improved sense of smell and taste.  Decreased coughing and sore throat.  Slower heart rate.  Lower blood pressure.  Clearer skin.  The ability to breathe more easily.  Fewer sick days. Quitting smoking is very challenging for most people. Do not get discouraged if you are not successful the first time. Some people need to make many attempts to quit before they achieve long-term success. Do your best to  stick to your quit plan, and talk with your health care provider if you have any questions or concerns. This information is not intended to replace advice given to you by your health care provider. Make sure you discuss any questions you have with your health care provider. Document Released: 01/06/2001 Document Revised: 08/18/2016 Document Reviewed: 05/29/2014 Elsevier Interactive Patient Education  2019 [employer].

## 2018-03-04 ENCOUNTER — Encounter: Payer: Self-pay | Admitting: Adult Health

## 2018-03-04 ENCOUNTER — Encounter: Payer: Self-pay | Admitting: Internal Medicine

## 2018-03-04 DIAGNOSIS — E785 Hyperlipidemia, unspecified: Secondary | ICD-10-CM | POA: Insufficient documentation

## 2018-03-04 LAB — URINALYSIS, ROUTINE W REFLEX MICROSCOPIC
Bilirubin Urine: NEGATIVE
Glucose, UA: NEGATIVE
HGB URINE DIPSTICK: NEGATIVE
HYALINE CAST: NONE SEEN /LPF
KETONES UR: NEGATIVE
Nitrite: NEGATIVE
Protein, ur: NEGATIVE
Specific Gravity, Urine: 1.024 (ref 1.001–1.03)
pH: 5.5 (ref 5.0–8.0)

## 2018-03-04 LAB — CBC WITH DIFFERENTIAL/PLATELET
Absolute Monocytes: 767 cells/uL (ref 200–950)
Basophils Absolute: 28 cells/uL (ref 0–200)
Basophils Relative: 0.4 %
EOS ABS: 92 {cells}/uL (ref 15–500)
Eosinophils Relative: 1.3 %
HCT: 38.4 % (ref 35.0–45.0)
Hemoglobin: 12.9 g/dL (ref 11.7–15.5)
Lymphs Abs: 2655 cells/uL (ref 850–3900)
MCH: 29.8 pg (ref 27.0–33.0)
MCHC: 33.6 g/dL (ref 32.0–36.0)
MCV: 88.7 fL (ref 80.0–100.0)
MONOS PCT: 10.8 %
MPV: 11 fL (ref 7.5–12.5)
Neutro Abs: 3557 cells/uL (ref 1500–7800)
Neutrophils Relative %: 50.1 %
PLATELETS: 224 10*3/uL (ref 140–400)
RBC: 4.33 10*6/uL (ref 3.80–5.10)
RDW: 13.4 % (ref 11.0–15.0)
TOTAL LYMPHOCYTE: 37.4 %
WBC: 7.1 10*3/uL (ref 3.8–10.8)

## 2018-03-04 LAB — COMPLETE METABOLIC PANEL WITH GFR
AG RATIO: 1.9 (calc) (ref 1.0–2.5)
ALT: 25 U/L (ref 6–29)
AST: 20 U/L (ref 10–30)
Albumin: 4.4 g/dL (ref 3.6–5.1)
Alkaline phosphatase (APISO): 49 U/L (ref 31–125)
BILIRUBIN TOTAL: 0.4 mg/dL (ref 0.2–1.2)
BUN: 11 mg/dL (ref 7–25)
CHLORIDE: 105 mmol/L (ref 98–110)
CO2: 25 mmol/L (ref 20–32)
Calcium: 9.5 mg/dL (ref 8.6–10.2)
Creat: 0.64 mg/dL (ref 0.50–1.10)
GFR, EST AFRICAN AMERICAN: 144 mL/min/{1.73_m2} (ref >=60)
GFR, Est Non African American: 124 mL/min/{1.73_m2} (ref >=60)
Globulin: 2.3 g/dL (calc) (ref 1.9–3.7)
Glucose, Bld: 74 mg/dL (ref 65–99)
POTASSIUM: 4.3 mmol/L (ref 3.5–5.3)
SODIUM: 138 mmol/L (ref 135–146)
Total Protein: 6.7 g/dL (ref 6.1–8.1)

## 2018-03-04 LAB — LIPID PANEL
CHOLESTEROL: 186 mg/dL (ref <200)
HDL: 36 mg/dL — ABNORMAL LOW (ref >=50)
LDL Cholesterol (Calc): 128 mg/dL (calc) — ABNORMAL HIGH
NON-HDL CHOLESTEROL (CALC): 150 mg/dL — AB (ref <130)
Total CHOL/HDL Ratio: 5.2 (calc) — ABNORMAL HIGH (ref <5.0)
Triglycerides: 108 mg/dL (ref <150)

## 2018-03-04 LAB — HEMOGLOBIN A1C
Hgb A1c MFr Bld: 5.4 % of total Hgb (ref <5.7)
Mean Plasma Glucose: 108 (calc)
eAG (mmol/L): 6 (calc)

## 2018-03-04 LAB — MAGNESIUM: Magnesium: 2 mg/dL (ref 1.5–2.5)

## 2018-03-04 LAB — TSH: TSH: 3.4 mIU/L

## 2018-03-04 LAB — VITAMIN D 25 HYDROXY (VIT D DEFICIENCY, FRACTURES): Vit D, 25-Hydroxy: 25 ng/mL — ABNORMAL LOW (ref 30–100)

## 2018-03-04 NOTE — Addendum Note (Signed)
Addended by: Izora Ribas on: 03/04/2018 08:53 AM   Modules accepted: Orders

## 2018-03-18 ENCOUNTER — Encounter: Payer: Self-pay | Admitting: *Deleted

## 2018-03-28 ENCOUNTER — Ambulatory Visit
Admission: RE | Admit: 2018-03-28 | Discharge: 2018-03-28 | Disposition: A | Discharge status: Home / Self Care | Payer: 59 | Source: Ambulatory Visit | Attending: Adult Health | Admitting: Adult Health

## 2018-03-28 DIAGNOSIS — Z8742 Personal history of other diseases of the female genital tract: Secondary | ICD-10-CM

## 2018-03-28 DIAGNOSIS — R10814 Left lower quadrant abdominal tenderness: Secondary | ICD-10-CM

## 2018-04-08 ENCOUNTER — Other Ambulatory Visit: Payer: 59 | Admitting: Adult Health

## 2018-04-08 DIAGNOSIS — F17200 Nicotine dependence, unspecified, uncomplicated: Secondary | ICD-10-CM | POA: Diagnosis not present

## 2018-04-08 DIAGNOSIS — Z8701 Personal history of pneumonia (recurrent): Secondary | ICD-10-CM | POA: Diagnosis not present

## 2018-04-08 DIAGNOSIS — J22 Unspecified acute lower respiratory infection: Secondary | ICD-10-CM

## 2018-04-08 MED ORDER — DOXYCYCLINE HYCLATE 100 MG PO CAPS: ORAL_CAPSULE | ORAL | 0 refills | Status: DC

## 2018-04-08 MED ORDER — AZITHROMYCIN 250 MG PO TABS: ORAL_TABLET | ORAL | 1 refills | Status: DC

## 2018-04-08 MED ORDER — PREDNISONE 20 MG PO TABS: ORAL_TABLET | ORAL | 0 refills | Status: DC

## 2018-04-08 MED ORDER — ALBUTEROL SULFATE HFA 108 (90 BASE) MCG/ACT IN AERS: 2.0000 | INHALATION_SPRAY | RESPIRATORY_TRACT | 0 refills | Status: DC | PRN

## 2018-04-08 NOTE — Progress Notes (Signed)
Patient messaged via [place] after clinic closed for weekend reporting persistent respiratory symptoms after she was seen at urgent care last week and diagnosed with "viral infection" and prescribed cough medication. She reports she had a fever of 100.3 or so for the first 3 days which has resolved. She reports persistent cough since that time, foul taste in mouth, nasal congestion, worse in AM. She reports some "difficulty breathing" in the mornings, states her breathing feels "like when I had pneumonia."   She is a smoker with recent history of pneumonia, diagnosed 09/08/2017. Resolution was confirmed by CXR on 10/06/2017. She has since taken several courses of azithromycin for URI/sinusitis, most recently prescribed 02/14/2018.   Will send in doxycycline, steroid taper and albuterol inhaler to her pharmacy on file.   Over 15 minutes of interview, counseling, medication and chart review, and decision making was performed this visit. Patient was encouraged to follow up if not improving.

## 2018-04-12 ENCOUNTER — Other Ambulatory Visit: Payer: Self-pay

## 2018-04-12 ENCOUNTER — Ambulatory Visit (INDEPENDENT_AMBULATORY_CARE_PROVIDER_SITE_OTHER): Payer: 59 | Admitting: Internal Medicine

## 2018-04-12 ENCOUNTER — Encounter: Payer: Self-pay | Admitting: Internal Medicine

## 2018-04-12 VITALS — BP 100/50 | HR 88 | Temp 97.8°F | Ht 65.0 in | Wt 127.1 lb

## 2018-04-12 DIAGNOSIS — Z1509 Genetic susceptibility to other malignant neoplasm: Secondary | ICD-10-CM | POA: Diagnosis not present

## 2018-04-12 DIAGNOSIS — K5900 Constipation, unspecified: Secondary | ICD-10-CM

## 2018-04-12 MED ORDER — SUPREP BOWEL PREP KIT 17.5-3.13-1.6 GM/177ML PO SOLN: 1.0000 | ORAL | 0 refills | Status: DC

## 2018-04-12 NOTE — Progress Notes (Signed)
Patient ID: Melissa Cantu, female   DOB: January 11, 1991, 28 y.o.   MRN: [id number] HPI: Melissa Cantu is a 28 yo female with PMH of abnormal pap smears, asthma, headaches and now genetics revealing Lynch syndrome who is seen to establish care for Lynch syndrome.  Her mother also has Lynch syndrome.  She is here alone today and seen in consult at the request of Dr. Melford Aase.  She reports that her mother has Lynch syndrome and she had genetic testing revealing that she was positive for the MSH2 gene.  She has never had an upper endoscopy or colonoscopy.  She reports that she has struggled with predominantly constipation but she states that she has a cycle of not going to the bathroom for multiple days in a row, followed by the development abdominal bloating, hard stool and then 1 to 2 days of loose more frequent stool before the cycle starts over.  No blood in her stool or melena.  With eating she can feel an upper abdominal contracting discomfort.  This comes and goes.  For the last 2 months she has been feeling full rather quickly.  She does have heartburn particularly when she eats peanut butter and garlic.  Her appetite has fluctuated.  Occasional nausea but no vomiting.  No dysphagia or odynophagia.  She is being treated by primary care for a URI with doxycycline, prednisone and albuterol inhaler.  She denies shortness of breath and feels her cough is improving.  She has a history of abnormal Pap smears and does follow with her gynecologist.   Past Medical History:  Diagnosis Date  . 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   Marland Kitchen ADHD (attention deficit hyperactivity disorder)   . Anemia   . Asthma   . Chronic headaches   . Hemorrhoids   . MSH2-related Lynch syndrome (HNPCC1)   . Pneumonia   . UTI (urinary tract infection)     Past Surgical History:  Procedure Laterality Date  . adnoidectomy    . HERNIA REPAIR     umbillical repair as  infant  . TONSILLECTOMY      Outpatient Medications Prior to Visit  Medication Sig Dispense Refill  . albuterol (VENTOLIN HFA) 108 (90 Base) MCG/ACT inhaler Inhale 2 puffs into the lungs every 4 (four) hours as needed for wheezing or shortness of breath. 1 Inhaler 0  . ALPRAZolam (XANAX) 0.5 MG tablet 1/2-1 tablet as needed daily for anxiety 20 tablet 0  . doxycycline (VIBRAMYCIN) 100 MG capsule Take 1 capsule twice daily with food 20 capsule 0  . Levocetirizine Dihydrochloride (XYZAL PO) Take by mouth.    . lisdexamfetamine (VYVANSE) 50 MG capsule Take 1 tab by mouth as needed on days that you work only. Script should ~4 months. 90 capsule 0  . predniSONE (DELTASONE) 20 MG tablet 2 tablets daily for 3 days, 1 tablet daily for 4 days. 10 tablet 0  . metroNIDAZOLE (METROGEL) 0.75 % vaginal gel Place 1 Applicatorful vaginally at bedtime. 1 applicator at night for 5 days 70 g 1   No facility-administered medications prior to visit.     Allergies  Allergen Reactions  . Banana Itching  . Citrullus Vulgaris Itching    All melons     Family History  Problem Relation Age of Onset  . Cancer Sister 65       cervical cancer  . Other Mother   . Colon polyps Mother   .  Diabetes Mother   . Hypertension Mother   . Cancer Father        [place]'s  . Breast cancer Maternal Grandmother 79  . Colon cancer Maternal Grandfather 65  . Skin cancer Maternal Grandfather   . Prostate cancer Maternal Grandfather   . Kidney cancer Maternal Grandfather   . Breast cancer Paternal Grandmother   . Parkinson's disease Paternal Grandfather   . Colon cancer Maternal Uncle   . Breast cancer Maternal Aunt     Social History   Tobacco Use  . Smoking status: Current Every Day Smoker    Packs/day: 1.00    Types: Cigarettes  . Smokeless tobacco: Never Used  . Tobacco comment: cutting down  Substance Use Topics  . Alcohol use: Yes    Comment: <3 drinks per month  . Drug use: Not Currently     Comment: former marijuana    ROS: As per history of present illness, otherwise negative  BP (!) 100/50 (BP Location: Right Arm, Patient Position: Sitting, Cuff Size: Normal)   Pulse 88   Temp 97.8 F (36.6 C) (Oral)   Ht 5' 5" (1.651 m)   Wt 127 lb 2 oz (57.7 kg)   BMI 21.15 kg/m  Gen: awake, alert, NAD HEENT: anicteric, op clear CV: RRR, no mrg Pulm: CTA b/l Abd: soft, NT/ND, +BS throughout Ext: no c/c/e Neuro: nonfocal   RELEVANT LABS AND IMAGING: CBC    Component Value Date/Time   WBC 7.1 03/03/2018 1108   RBC 4.33 03/03/2018 1108   HGB 12.9 03/03/2018 1108   HCT 38.4 03/03/2018 1108   PLT 224 03/03/2018 1108   MCV 88.7 03/03/2018 1108   MCH 29.8 03/03/2018 1108   MCHC 33.6 03/03/2018 1108   RDW 13.4 03/03/2018 1108   LYMPHSABS 2,655 03/03/2018 1108   MONOABS 430 10/03/2015 1156   EOSABS 92 03/03/2018 1108   BASOSABS 28 03/03/2018 1108    CMP     Component Value Date/Time   NA 138 03/03/2018 1108   K 4.3 03/03/2018 1108   CL 105 03/03/2018 1108   CO2 25 03/03/2018 1108   GLUCOSE 74 03/03/2018 1108   BUN 11 03/03/2018 1108   CREATININE 0.64 03/03/2018 1108   CALCIUM 9.5 03/03/2018 1108   PROT 6.7 03/03/2018 1108   ALBUMIN 4.5 10/03/2015 1156   AST 20 03/03/2018 1108   ALT 25 03/03/2018 1108   ALKPHOS 45 10/03/2015 1156   BILITOT 0.4 03/03/2018 1108   GFRNONAA 124 03/03/2018 1108   GFRAA 144 03/03/2018 1108    ASSESSMENT/PLAN:  28 yo female with PMH of abnormal pap smears, asthma, headaches and now genetics revealing Lynch syndrome who is seen to establish care for Lynch syndrome.  1.  Lynch syndrome/MSH 2 gene mutation --we discussed Lynch syndrome today including the recommendations for colonoscopy annually beginning now.  I have also recommended upper endoscopy to exclude gastric and duodenal polyps but also exclude H. pylori.  Upper endoscopy interval is less clear but will be considered every 2 to 3 years, particularly at age 68.  We also  discussed the importance of screening for uterine and ovarian malignancy and I have recommended that she follow closely with gynecology.  Urine testing beginning at age 28 is also recommended as well as annual physical with neurologic examination.  --We are proceeding with upper endoscopy and colonoscopy.  We discussed the risk, benefits and alternatives and she is agreeable and wishes to proceed  2.  Constipation alternating with diarrhea/abdominal pain --  constipation predominance is felt to explain a significant amount of her GI symptoms.  We are proceeding to upper and lower endoscopy as discussed above.  I recommended that she begin MiraLAX 17 g daily.  Hopefully with more bowel regularity she will avoid the back-and-forth episodes also associated with her abdominal pain.   XB:[id number], Gwyndolyn Saxon, [address] [place], [place] [id number]

## 2018-04-12 NOTE — Patient Instructions (Signed)
You have been scheduled for an endoscopy and colonoscopy. Please follow the written instructions given to you at your visit today. Please pick up your prep supplies at the pharmacy within the next 1-3 days. If you use inhalers (even only as needed), please bring them with you on the day of your procedure. Your physician has requested that you go to www.startemmi.com and enter the access code given to you at your visit today. This web site gives a general overview about your procedure. However, you should still follow specific instructions given to you by our office regarding your preparation for the procedure.  Please purchase the following medications over the counter and take as directed: Miralax 17 grams (1 capful) dissolved in at least 8 ounces water/juice daily  If you are age 38 or older, your body mass index should be between 23-30. Your Body mass index is 21.15 kg/m. If this is out of the aforementioned range listed, please consider follow up with your Primary Care Provider.  If you are age 76 or younger, your body mass index should be between 19-25. Your Body mass index is 21.15 kg/m. If this is out of the aformentioned range listed, please consider follow up with your Primary Care Provider.

## 2018-04-17 ENCOUNTER — Telehealth: Payer: 59 | Admitting: Physician Assistant

## 2018-04-17 DIAGNOSIS — R059 Cough, unspecified: Secondary | ICD-10-CM

## 2018-04-17 DIAGNOSIS — R0602 Shortness of breath: Secondary | ICD-10-CM

## 2018-04-17 DIAGNOSIS — R05 Cough: Secondary | ICD-10-CM

## 2018-04-17 MED ORDER — BENZONATATE 100 MG PO CAPS: 100.0000 mg | ORAL_CAPSULE | Freq: Three times a day (TID) | ORAL | 0 refills | Status: DC | PRN

## 2018-04-17 MED ORDER — ALBUTEROL SULFATE HFA 108 (90 BASE) MCG/ACT IN AERS: 2.0000 | INHALATION_SPRAY | RESPIRATORY_TRACT | 0 refills | Status: DC | PRN

## 2018-04-17 NOTE — Progress Notes (Signed)
E-Visit for Corona Virus Screening  Based on your current symptoms, you may very well have the virus, however your symptoms are mild. Currently, not all patients are being tested. If the symptoms are mild and there is not a known exposure, performing the test is not indicated.  Coronavirus disease 2019 (COVID-19) is a respiratory illness that can spread from person to person. The virus that causes COVID-19 is a new virus that was first identified in the country of [place] but is now found in multiple other countries and has spread to the [place].  Symptoms associated with the virus are mild to severe fever, cough, and shortness of breath. There is currently no vaccine to protect against COVID-19, and there is no specific antiviral treatment for the virus.   To be considered HIGH RISK for Coronavirus (COVID-19), you have to meet the following criteria:  . Traveled to [place], [place], [place], [place] or [place]; or in the [place] to [place], [place], [place], or [place]; and have fever, cough, and shortness of breath within the last 2 weeks of travel OR  . Been in close contact with a person diagnosed with COVID-19 within the last 2 weeks and have fever, cough, and shortness of breath  . IF YOU DO NOT MEET THESE CRITERIA, YOU ARE CONSIDERED LOW RISK FOR COVID-19.   It is vitally important that if you feel that you have an infection such as this virus or any other virus that you stay home and away from places where you may spread it to others.  You should self-quarantine for 14 days if you have symptoms that could potentially be coronavirus and avoid contact with people age 83 and older.   You can use medication such as A prescription cough medication called Tessalon Perles 100 mg. You may take 1-2 capsules every 8 hours as needed for cough and A prescription inhaler called Albuterol MDI 90 mcg /actuation 2 puffs every 4 hours as needed for shortness of breath, wheezing, cough  You may  also take acetaminophen (Tylenol) as needed for fever.   Reduce your risk of any infection by using the same precautions used for avoiding the common cold or flu:  Marland Kitchen Wash your hands often with soap and warm water for at least 20 seconds.  If soap and water are not readily available, use an alcohol-based hand sanitizer with at least 60% alcohol.  . If coughing or sneezing, cover your mouth and nose by coughing or sneezing into the elbow areas of your shirt or coat, into a tissue or into your sleeve (not your hands). . Avoid shaking hands with others and consider head nods or verbal greetings only. . Avoid touching your eyes, nose, or mouth with unwashed hands.  . Avoid close contact with people who are sick. . Avoid places or events with large numbers of people in one location, like concerts or sporting events. . Carefully consider travel plans you have or are making. . If you are planning any travel outside or inside the [place], visit the CDC's Travelers' Health webpage for the latest health notices. . If you have some symptoms but not all symptoms, continue to monitor at home and seek medical attention if your symptoms worsen. . If you are having a medical emergency, call 911.  HOME CARE . Only take medications as instructed by your medical team. . Drink plenty of fluids and get plenty of rest. . A steam or ultrasonic humidifier can help if you  have congestion.   GET HELP RIGHT AWAY IF: . You develop worsening fever. . You become short of breath . You cough up blood. . Your symptoms become more severe MAKE SURE YOU   Understand these instructions.  Will watch your condition.  Will get help right away if you are not doing well or get worse.  Your e-visit answers were reviewed by a board certified advanced clinical practitioner to complete your personal care plan.  Depending on the condition, your plan could have included both over the counter or prescription medications.  If there is a  problem please reply once you have received a response from your provider. Your safety is important to [place].  If you have drug allergies check your prescription carefully.    You can use MyChart to ask questions about today's visit, request a non-urgent call back, or ask for a work or school excuse for 24 hours related to this e-Visit. If it has been greater than 24 hours you will need to follow up with your provider, or enter a new e-Visit to address those concerns. You will get an e-mail in the next two days asking about your experience.  I hope that your e-visit has been valuable and will speed your recovery. Thank you for using e-visits.   I have spent 7 min in completion and review of this note- Lacy Duverney [facility]

## 2018-04-21 ENCOUNTER — Telehealth: Payer: Self-pay | Admitting: *Deleted

## 2018-04-21 NOTE — Telephone Encounter (Signed)
Spoke with pt's mother to notify of cancellation of procedure and we will call back as soon as schedules open to reschedule.

## 2018-04-28 ENCOUNTER — Other Ambulatory Visit: Payer: Self-pay | Admitting: Adult Health

## 2018-04-28 ENCOUNTER — Encounter: Payer: Self-pay | Admitting: Internal Medicine

## 2018-05-26 ENCOUNTER — Telehealth: Payer: Self-pay | Admitting: *Deleted

## 2018-05-26 NOTE — Telephone Encounter (Signed)
Pt rescheduled to 06-23-18 at 800.  New instructions mailed to pt.  Pt states she still has prep at home.

## 2018-06-17 ENCOUNTER — Encounter: Payer: Self-pay | Admitting: Adult Health

## 2018-06-21 ENCOUNTER — Telehealth: Payer: Self-pay

## 2018-06-21 NOTE — Telephone Encounter (Signed)
Covid-19 travel screening questions  Have you traveled in the last 14 days? No If yes where?  Do you now or have you had a fever in the last 14 days? No  Do you have any respiratory symptoms of shortness of breath or cough now or in the last 14 days? No  Do you have any family members or close contacts with diagnosed or suspected Covid-19? No       

## 2018-06-23 ENCOUNTER — Ambulatory Visit (AMBULATORY_SURGERY_CENTER): Payer: 59 | Admitting: Internal Medicine

## 2018-06-23 ENCOUNTER — Other Ambulatory Visit: Payer: Self-pay

## 2018-06-23 ENCOUNTER — Encounter: Payer: Self-pay | Admitting: Internal Medicine

## 2018-06-23 VITALS — BP 103/50 | HR 88 | Temp 98.7°F | Resp 14 | Ht 65.0 in | Wt 127.0 lb

## 2018-06-23 DIAGNOSIS — Z1211 Encounter for screening for malignant neoplasm of colon: Secondary | ICD-10-CM

## 2018-06-23 DIAGNOSIS — K635 Polyp of colon: Secondary | ICD-10-CM

## 2018-06-23 DIAGNOSIS — Z1509 Genetic susceptibility to other malignant neoplasm: Secondary | ICD-10-CM

## 2018-06-23 DIAGNOSIS — D125 Benign neoplasm of sigmoid colon: Secondary | ICD-10-CM

## 2018-06-23 DIAGNOSIS — B9681 Helicobacter pylori [H. pylori] as the cause of diseases classified elsewhere: Secondary | ICD-10-CM

## 2018-06-23 DIAGNOSIS — K2950 Unspecified chronic gastritis without bleeding: Secondary | ICD-10-CM

## 2018-06-23 DIAGNOSIS — K2970 Gastritis, unspecified, without bleeding: Secondary | ICD-10-CM | POA: Diagnosis not present

## 2018-06-23 MED ORDER — SODIUM CHLORIDE 0.9 % IV SOLN: 500.0000 mL | Freq: Once | INTRAVENOUS | Status: DC

## 2018-06-23 NOTE — Progress Notes (Signed)
Report to PACU, RN, vss, BBS= Clear.  

## 2018-06-23 NOTE — Progress Notes (Signed)
Pt's states no medical or surgical changes since previsit or office visit. 

## 2018-06-23 NOTE — Op Note (Signed)
[place] [facility] Patient Name: Melissa Cantu Procedure Date: 06/23/2018 7:17 AM MRN: [id number] Endoscopist: Jerene Bears , MD Age: 28 Referring MD:  Date of Birth: [birth year] Gender: Female Account #: [id number] Procedure:                Colonoscopy Indications:              This is the patient's first colonoscopy, Lynch                            Syndrome Medicines:                Monitored Anesthesia Care Procedure:                Pre-Anesthesia Assessment:                           - Prior to the procedure, a History and Physical                            was performed, and patient medications and                            allergies were reviewed. The patient's tolerance of                            previous anesthesia was also reviewed. The risks                            and benefits of the procedure and the sedation                            options and risks were discussed with the patient.                            All questions were answered, and informed consent                            was obtained. Prior Anticoagulants: The patient has                            taken no previous anticoagulant or antiplatelet                            agents. ASA Grade Assessment: II - A patient with                            mild systemic disease. After reviewing the risks                            and benefits, the patient was deemed in                            satisfactory condition to undergo the procedure.  After obtaining informed consent, the colonoscope                            was passed under direct vision. Throughout the                            procedure, the patient's blood pressure, pulse, and                            oxygen saturations were monitored continuously. The                            Colonoscope was introduced through the anus and                            advanced to the terminal ileum. The colonoscopy was                        performed without difficulty. The patient tolerated                            the procedure well. The quality of the bowel                            preparation was excellent. The terminal ileum,                            ileocecal valve, appendiceal orifice, and rectum                            were photographed. Scope In: 8:15:57 AM Scope Out: 8:37:12 AM Scope Withdrawal Time: 0 hours 17 minutes 3 seconds  Total Procedure Duration: 0 hours 21 minutes 15 seconds  Findings:                 The digital rectal exam was normal.                           The terminal ileum appeared normal.                           A 7 mm polyp was found in the sigmoid colon. The                            polyp was sessile. The polyp was removed with a                            cold snare. Resection and retrieval were complete.                           The exam was otherwise without abnormality on                            direct and retroflexion views. Complications:  No immediate complications. Estimated Blood Loss:     Estimated blood loss was minimal. Impression:               - The examined portion of the ileum was normal.                           - One 7 mm polyp in the sigmoid colon, removed with                            a cold snare. Resected and retrieved.                           - The examination was otherwise normal on direct                            and retroflexion views. Recommendation:           - Patient has a contact number available for                            emergencies. The signs and symptoms of potential                            delayed complications were discussed with the                            patient. Return to normal activities tomorrow.                            Written discharge instructions were provided to the                            patient.                           - Resume previous diet.                           -  Continue present medications.                           - Await pathology results.                           - Repeat colonoscopy in 1 year for screening                            purposes and for surveillance. Jerene Bears, MD 06/23/2018 8:46:53 AM This report has been signed electronically.

## 2018-06-23 NOTE — Progress Notes (Signed)
Called to room to assist during endoscopic procedure.  Patient ID and intended procedure confirmed with present staff. Received instructions for my participation in the procedure from the performing physician.  

## 2018-06-23 NOTE — Op Note (Signed)
[place] Patient Name: Melissa Cantu Procedure Date: 06/23/2018 7:18 AM MRN: [id number] Endoscopist: Jerene Bears , MD Age: 28 Referring MD:  Date of Birth: 1990/11/16 Gender: Female Account #: [id number] Procedure:                Upper GI endoscopy Indications:              Hereditary nonpolyposis colorectal cancer (Lynch                            Syndrome) Medicines:                Monitored Anesthesia Care Procedure:                Pre-Anesthesia Assessment:                           - Prior to the procedure, a History and Physical                            was performed, and patient medications and                            allergies were reviewed. The patient's tolerance of                            previous anesthesia was also reviewed. The risks                            and benefits of the procedure and the sedation                            options and risks were discussed with the patient.                            All questions were answered, and informed consent                            was obtained. Prior Anticoagulants: The patient has                            taken no previous anticoagulant or antiplatelet                            agents. ASA Grade Assessment: II - A patient with                            mild systemic disease. After reviewing the risks                            and benefits, the patient was deemed in                            satisfactory condition to undergo the procedure.  After obtaining informed consent, the endoscope was                            passed under direct vision. Throughout the                            procedure, the patient's blood pressure, pulse, and                            oxygen saturations were monitored continuously. The                            Endoscope was introduced through the mouth, and                            advanced to the second part of duodenum. The upper                             GI endoscopy was accomplished without difficulty.                            The patient tolerated the procedure well. Scope In: Scope Out: Findings:                 The examined esophagus was normal.                           The entire examined stomach was normal. Biopsies                            were taken with a cold forceps for histology and                            Helicobacter pylori testing.                           The examined duodenum was normal. Complications:            No immediate complications. Estimated Blood Loss:     Estimated blood loss was minimal. Impression:               - Normal esophagus.                           - Normal stomach. Biopsied.                           - Normal examined duodenum. Recommendation:           - Patient has a contact number available for                            emergencies. The signs and symptoms of potential                            delayed complications were discussed with the  patient. Return to normal activities tomorrow.                            Written discharge instructions were provided to the                            patient.                           - Resume previous diet.                           - Continue present medications.                           - Await pathology results.                           - Repeat upper endoscopy in 3 years for screening                            purposes. Jerene Bears, MD 06/23/2018 8:41:36 AM This report has been signed electronically.

## 2018-06-23 NOTE — Patient Instructions (Signed)
Biopsies taken on upper endoscopy.  Await pathology.  Repeat upper endoscopy in 3 years.   Polyp removed on colonoscopy.  Await pathology.  Repeat colonoscopy in 1 year for surveillance.     YOU HAD AN ENDOSCOPIC PROCEDURE TODAY AT [facility]:   Refer to the procedure report that was given to you for any specific questions about what was found during the examination.  If the procedure report does not answer your questions, please call your gastroenterologist to clarify.  If you requested that your care partner not be given the details of your procedure findings, then the procedure report has been included in a sealed envelope for you to review at your convenience later.  YOU SHOULD EXPECT: Some feelings of bloating in the abdomen. Passage of more gas than usual.  Walking can help get rid of the air that was put into your GI tract during the procedure and reduce the bloating. If you had a lower endoscopy (such as a colonoscopy or flexible sigmoidoscopy) you may notice spotting of blood in your stool or on the toilet paper. If you underwent a bowel prep for your procedure, you may not have a normal bowel movement for a few days.  Please Note:  You might notice some irritation and congestion in your nose or some drainage.  This is from the oxygen used during your procedure.  There is no need for concern and it should clear up in a day or so.  SYMPTOMS TO REPORT IMMEDIATELY:   Following lower endoscopy (colonoscopy or flexible sigmoidoscopy):  Excessive amounts of blood in the stool  Significant tenderness or worsening of abdominal pains  Swelling of the abdomen that is new, acute  Fever of 100F or higher   Following upper endoscopy (EGD)  Vomiting of blood or coffee ground material  New chest pain or pain under the shoulder blades  Painful or persistently difficult swallowing  New shortness of breath  Fever of 100F or higher  Black, tarry-looking stools  For urgent or  emergent issues, a gastroenterologist can be reached at any hour by calling [phone].   DIET:  We do recommend a small meal at first, but then you may proceed to your regular diet.  Drink plenty of fluids but you should avoid alcoholic beverages for 24 hours.  ACTIVITY:  You should plan to take it easy for the rest of today and you should NOT DRIVE or use heavy machinery until tomorrow (because of the sedation medicines used during the test).    FOLLOW UP: Our staff will call the number listed on your records 48-72 hours following your procedure to check on you and address any questions or concerns that you may have regarding the information given to you following your procedure. If we do not reach you, we will leave a message.  We will attempt to reach you two times.  During this call, we will ask if you have developed any symptoms of COVID 19. If you develop any symptoms (ie: fever, flu-like symptoms, shortness of breath, cough etc.) before then, please call [phone].  If you test positive for Covid 19 in the 2 weeks post procedure, please call and report this information to [place].    If any biopsies were taken you will be contacted by phone or by letter within the next 1-3 weeks.  Please call us at [phone] if you have not heard about the biopsies in 3 weeks.    SIGNATURES/CONFIDENTIALITY: You and/or your  care partner have signed paperwork which will be entered into your electronic medical record.  These signatures attest to the fact that that the information above on your After Visit Summary has been reviewed and is understood.  Full responsibility of the confidentiality of this discharge information lies with you and/or your care-partner.

## 2018-06-27 ENCOUNTER — Telehealth: Payer: Self-pay | Admitting: Internal Medicine

## 2018-06-27 ENCOUNTER — Other Ambulatory Visit: Payer: Self-pay

## 2018-06-27 ENCOUNTER — Telehealth: Payer: Self-pay

## 2018-06-27 ENCOUNTER — Encounter: Payer: Self-pay | Admitting: Internal Medicine

## 2018-06-27 DIAGNOSIS — A048 Other specified bacterial intestinal infections: Secondary | ICD-10-CM

## 2018-06-27 MED ORDER — BIS SUBCIT-METRONID-TETRACYC 140-125-125 MG PO CAPS: 3.0000 | ORAL_CAPSULE | Freq: Three times a day (TID) | ORAL | 0 refills | Status: DC

## 2018-06-27 MED ORDER — OMEPRAZOLE 20 MG PO CPDR: 20.0000 mg | DELAYED_RELEASE_CAPSULE | Freq: Two times a day (BID) | ORAL | 0 refills | Status: DC

## 2018-06-27 NOTE — Telephone Encounter (Signed)
  Follow up Call-  Call back number 06/23/2018  Post procedure Call Back phone  # [phone]  Permission to leave phone message Yes  Some recent data might be hidden     Patient questions:  Do you have a fever, pain , or abdominal swelling? No. Pain Score  0 *  Have you tolerated food without any problems? Yes.    Have you been able to return to your normal activities? Yes.    Do you have any questions about your discharge instructions: Diet   No. Medications  No. Follow up visit  No.  Do you have questions or concerns about your Care? No.  Actions: * If pain score is 4 or above: No action needed, pain <4.  1. Have you developed a fever since your procedure? no  2.   Have you had an respiratory symptoms (SOB or cough) since your procedure? no  3.   Have you tested positive for COVID 19 since your procedure no  4.   Have you had any family members/close contacts diagnosed with the COVID 19 since your procedure?  no   If yes to any of these questions please route to Joylene John, RN and Alphonsa Gin, Therapist, sports.

## 2018-06-27 NOTE — Telephone Encounter (Signed)
See result note.  

## 2018-06-28 ENCOUNTER — Encounter: Payer: Self-pay | Admitting: Adult Health

## 2018-06-28 DIAGNOSIS — Z86010 Personal history of colonic polyps: Secondary | ICD-10-CM | POA: Insufficient documentation

## 2018-07-03 ENCOUNTER — Telehealth: Payer: Self-pay | Admitting: Nurse Practitioner

## 2018-07-03 NOTE — Telephone Encounter (Signed)
Refer to earlier phone call today. I called patient for follow up. Patient pushed fluids, urine still darker in color. Nausea is less, not worse. No vomiting. No abdominal pain. She now mentions feeling fatigued since she started Pylera as well. I advised that she stay off the Pylera, push fluids, discussed brown red urine discoloration can occur with Pylera. If she develops any significant abd pain or vomiting, profound fatigue to then to the ER. I will contact  Dr. Hilarie Fredrickson and his RN to follow up on pt Mon. 6/8 am, to determine if labs ie: CMP/CBC/UA required, discuss H. Pylori tx options.

## 2018-07-03 NOTE — Telephone Encounter (Signed)
Patient s/p EGD/colonoscopy with  Dr. Hilarie Fredrickson 5/28. She started Pylera on 6/1 for H. Pylori. She did not start Omeprazole. She developed headache and nausea first day she took Pylera. Today she noticed her urine was dark in color, described as iodine color. No abdominal pain or fever. NO alcohol intake while on Pylera. I advised that she stop Pylera, Hydrate now, push fluids, she will call me at 3pm today with further update, if urine still dark will send to urgent care for CMP, CBC and UA.

## 2018-07-04 ENCOUNTER — Telehealth: Payer: Self-pay

## 2018-07-04 ENCOUNTER — Other Ambulatory Visit (INDEPENDENT_AMBULATORY_CARE_PROVIDER_SITE_OTHER): Payer: 59

## 2018-07-04 ENCOUNTER — Other Ambulatory Visit: Payer: Self-pay

## 2018-07-04 DIAGNOSIS — A048 Other specified bacterial intestinal infections: Secondary | ICD-10-CM

## 2018-07-04 LAB — COMPREHENSIVE METABOLIC PANEL
ALT: 21 U/L (ref 0–35)
AST: 19 U/L (ref 0–37)
Albumin: 4.2 g/dL (ref 3.5–5.2)
Alkaline Phosphatase: 46 U/L (ref 39–117)
BUN: 12 mg/dL (ref 6–23)
CO2: 22 mEq/L (ref 19–32)
Calcium: 9.5 mg/dL (ref 8.4–10.5)
Chloride: 107 mEq/L (ref 96–112)
Creatinine, Ser: 0.59 mg/dL (ref 0.40–1.20)
GFR: 123.45 mL/min (ref >60.00)
Glucose, Bld: 106 mg/dL — ABNORMAL HIGH (ref 70–99)
Potassium: 3.8 mEq/L (ref 3.5–5.1)
Sodium: 137 mEq/L (ref 135–145)
Total Bilirubin: 0.3 mg/dL (ref 0.2–1.2)
Total Protein: 6.3 g/dL (ref 6.0–8.3)

## 2018-07-04 LAB — CBC WITH DIFFERENTIAL/PLATELET
Basophils Absolute: 0.1 10*3/uL (ref 0.0–0.1)
Basophils Relative: 0.9 % (ref 0.0–3.0)
Eosinophils Absolute: 0.4 10*3/uL (ref 0.0–0.7)
Eosinophils Relative: 4 % (ref 0.0–5.0)
HCT: 39.1 % (ref 36.0–46.0)
Hemoglobin: 12.6 g/dL (ref 12.0–15.0)
Lymphocytes Relative: 42.3 % (ref 12.0–46.0)
Lymphs Abs: 4 10*3/uL (ref 0.7–4.0)
MCHC: 32.2 g/dL (ref 30.0–36.0)
MCV: 92.4 fl (ref 78.0–100.0)
Monocytes Absolute: 0.8 10*3/uL (ref 0.1–1.0)
Monocytes Relative: 8 % (ref 3.0–12.0)
Neutro Abs: 4.2 10*3/uL (ref 1.4–7.7)
Neutrophils Relative %: 44.8 % (ref 43.0–77.0)
Platelets: 234 10*3/uL (ref 150.0–400.0)
RBC: 4.23 Mil/uL (ref 3.87–5.11)
RDW: 14.3 % (ref 11.5–15.5)
WBC: 9.4 10*3/uL (ref 4.0–10.5)

## 2018-07-04 MED ORDER — AMOXICILL-CLARITHRO-LANSOPRAZ PO MISC: Freq: Two times a day (BID) | ORAL | 0 refills | Status: DC

## 2018-07-04 NOTE — Telephone Encounter (Signed)
-----   Message from Jerene Bears, MD sent at 07/04/2018 11:01 AM EDT ----- Regarding: RE: RXN to Pylera Thanks Priscille Heidelberg, please have her come for CBC and CMP  We will then to try PrevPak to get her H pylori treated Thanks JMP ----- Message ----- From: Noralyn Pick, NP Sent: 07/03/2018   2:39 PM EDT To: Algernon Huxley, RN, Jerene Bears, MD Subject: Margaret Pyle to Pylera                                  Hello Dr. Hilarie Fredrickson and Scarlett Presto called the answering service today with complaints of nausea, headaches and fatigue since starting Pylera on 6/1 for H. Pylori. Today, her urine was iodine color. No fever, vomiting or abd pain. No alcohol intake while on Pylera. I discussed Pylera can cause nausea, HA and red brown urine as a side effect. I advised that she stop the Pylera (first dose was 6/1, she never started the Prilosec), push fluids and if she developed worsening sx  this evening/over night to go to the ER. Vaughan Basta, can you please call her in the morning and provide Dr. Hilarie Fredrickson with her sx update. Thank you. [employer]

## 2018-07-04 NOTE — Telephone Encounter (Signed)
Spoke with pt and she will come for labs, orders in epic, script for prevpak sent to pharmacy.

## 2018-07-05 ENCOUNTER — Other Ambulatory Visit: Payer: 59

## 2018-07-05 DIAGNOSIS — A048 Other specified bacterial intestinal infections: Secondary | ICD-10-CM

## 2018-07-06 LAB — HELICOBACTER PYLORI  SPECIAL ANTIGEN
MICRO NUMBER:: 551355
SPECIMEN QUALITY: ADEQUATE

## 2018-08-30 ENCOUNTER — Other Ambulatory Visit: Payer: Self-pay

## 2018-08-30 DIAGNOSIS — A048 Other specified bacterial intestinal infections: Secondary | ICD-10-CM

## 2018-09-05 NOTE — Progress Notes (Deleted)
FOLLOW UP  Assessment and Plan:   Smoker Discussed risks associated with tobacco use and advised to reduce or quit Patient is not ready to do so, but advised to consider strongly Will follow up at the next visit  Lynch syndrome Continue follow up GYN/GI for aggressive cancer screening  Adjustment disorder with mixed anxiety and depressed mood Currently off of medications other than rare xanax and vyvanse for ADD Possible bipolar, recommended psych for full screening, and recommended counselling for home stress She declines any medications today Lifestyle discussed: diet/exerise, sleep hygiene, stress management, hydration -     ALPRAZolam (XANAX) 0.5 MG tablet; 1/2-1 tablet as needed daily for anxiety  Attention deficit disorder, unspecified hyperactivity presence Continue medications  Helps with focus, no AE's. The patient was counseled on the addictive nature of the medication and was encouraged to take drug holidays when not needed.   BMI 22.0-22.9, adult Continue to recommend diet heavy in fruits and veggies and low in animal meats, cheeses, and dairy products, appropriate calorie intake Discuss exercise recommendations routinely Continue to monitor weight at each visit  Hyperlipidemia *** -     Lipid panel  Vitamin D deficiency *** -     VITAMIN D 25 Hydroxy (Vit-D Deficiency, Fractures)  Continue diet and meds as discussed. Further disposition pending results of labs. Discussed med's effects and SE's.   Over 30 minutes of exam, counseling, chart review, and critical decision making was performed.   Future Appointments  Date Time Provider [place]  09/07/2018 10:45 AM Liane Comber, NP [facility] None  09/13/2018  9:00 AM Garald Balding, MD [facility] None  03/14/2019 10:00 AM Liane Comber, NP [facility] None    ----------------------------------------------------------------------------------------------------------------------  HPI 28 y.o. female   presents for 6 month follow up on cholesterol, ADD, smoking and vitamin D deficiency.   She has hx of Lynch syndrome and will require aggressive cancer screening, follows with GYN and underwent EGD and colonoscopy by Dr. Hilarie Fredrickson on 06/23/2018 which showed H. Pylori and colon polyp and has been recommended 1 year follow up.    Patient is on an ADD medication, currently taking vyvanse 50 mg daily on days that she works, she states that the medication is helping and she denies any adverse reactions.   She is under a lot of stress due to disagreements with mom. Has done counseling, wants to speak with psych about possible bipolar screening due to labile emotions. She is currently prescribed xanax ***  she currently continues to smoke 0.75-1 pack a day, depends on stress, she does not vape; discussed risks associated with smoking, patient is not ready to quit.   BMI is There is no height or weight on file to calculate BMI., she {HAS HAS NOM:76720} been working on diet and exercise. Wt Readings from Last 3 Encounters:  06/23/18 127 lb (57.6 kg)  04/12/18 127 lb 2 oz (57.7 kg)  03/03/18 133 lb (60.3 kg)    Today their BP is    She {DOES_DOES NOB:09628} workout. She denies chest pain, shortness of breath, dizziness.   She is not on cholesterol medication. Her cholesterol is not at goal. The cholesterol last visit was:   Lab Results  Component Value Date   CHOL 186 03/03/2018   HDL 36 (L) 03/03/2018   LDLCALC 128 (H) 03/03/2018   TRIG 108 03/03/2018   CHOLHDL 5.2 (H) 03/03/2018   Patient is *** on Vitamin D supplement.   Lab Results  Component Value Date   VD25OH  25 (L) 03/03/2018       Current Medications:  Current Outpatient Medications on File Prior to Visit  Medication Sig  . albuterol (PROVENTIL HFA;VENTOLIN HFA) 108 (90 Base) MCG/ACT inhaler Use 1 2 inhalations 15 minutes apart every 4 hours if needed to rescue Asthma  . ALPRAZolam (XANAX) 0.5 MG tablet 1/2-1 tablet as needed daily  for anxiety  . amoxicillin-clarithromycin-lansoprazole (PREVPAC) combo pack Take by mouth 2 (two) times daily. Follow package directions.  . benzonatate (TESSALON) 100 MG capsule Take 1-2 capsules (100-200 mg total) by mouth 3 (three) times daily as needed for cough.  . bismuth-metronidazole-tetracycline (PYLERA) 140-125-125 MG capsule Take 3 capsules by mouth 4 (four) times daily -  before meals and at bedtime.  Marland Kitchen doxycycline (VIBRAMYCIN) 100 MG capsule Take 1 capsule twice daily with food (Patient not taking: Reported on 06/23/2018)  . Levocetirizine Dihydrochloride (XYZAL PO) Take by mouth.  . lisdexamfetamine (VYVANSE) 50 MG capsule Take 1 tab by mouth as needed on days that you work only. Script should ~4 months.  Marland Kitchen omeprazole (PRILOSEC) 20 MG capsule Take 1 capsule (20 mg total) by mouth 2 (two) times daily before a meal.   No current facility-administered medications on file prior to visit.      Allergies:  Allergies  Allergen Reactions  . Banana Itching  . Citrullus Vulgaris Itching    All melons      Medical History:  Past Medical History:  Diagnosis Date  . 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   Marland Kitchen ADHD (attention deficit hyperactivity disorder)   . Anemia   . Asthma   . Chronic headaches   . Hemorrhoids   . MSH2-related Lynch syndrome (HNPCC1)   . Pneumonia   . UTI (urinary tract infection)    Family history- Reviewed and unchanged Social history- Reviewed and unchanged   Review of Systems:  Review of Systems  Constitutional: Negative for malaise/fatigue and weight loss.  HENT: Negative for hearing loss and tinnitus.   Eyes: Negative for blurred vision and double vision.  Respiratory: Negative for cough, shortness of breath and wheezing.   Cardiovascular: Negative for chest pain, palpitations, orthopnea, claudication and leg swelling.  Gastrointestinal: Negative for abdominal pain, blood in stool,  constipation, diarrhea, heartburn, melena, nausea and vomiting.  Genitourinary: Negative.   Musculoskeletal: Negative for joint pain and myalgias.  Skin: Negative for rash.  Neurological: Negative for dizziness, tingling, sensory change, weakness and headaches.  Endo/Heme/Allergies: Negative for polydipsia.  Psychiatric/Behavioral: Negative.   All other systems reviewed and are negative.     Physical Exam: There were no vitals taken for this visit. Wt Readings from Last 3 Encounters:  06/23/18 127 lb (57.6 kg)  04/12/18 127 lb 2 oz (57.7 kg)  03/03/18 133 lb (60.3 kg)   General Appearance: Well nourished, in no apparent distress. Eyes: PERRLA, EOMs, conjunctiva no swelling or erythema Sinuses: No Frontal/maxillary tenderness ENT/Mouth: Ext aud canals clear, TMs without erythema, bulging. No erythema, swelling, or exudate on post pharynx.  Tonsils not swollen or erythematous. Hearing normal.  Neck: Supple, thyroid normal.  Respiratory: Respiratory effort normal, BS equal bilaterally without rales, rhonchi, wheezing or stridor.  Cardio: RRR with no MRGs. Brisk peripheral pulses without edema.  Abdomen: Soft, + BS.  Non tender, no guarding, rebound, hernias, masses. Lymphatics: Non tender without lymphadenopathy.  Musculoskeletal: Full ROM, 5/5 strength, {PSY - GAIT AND STATION:22860} gait Skin: Warm, dry without rashes, lesions,  ecchymosis.  Neuro: Cranial nerves intact. No cerebellar symptoms.  Psych: Awake and oriented X 3, normal affect, Insight and Judgment appropriate.    Izora Ribas, NP 2:08 PM [facility]

## 2018-09-07 ENCOUNTER — Ambulatory Visit: Payer: Self-pay | Admitting: Adult Health

## 2018-09-13 ENCOUNTER — Encounter: Payer: Self-pay | Admitting: Orthopaedic Surgery

## 2018-09-13 ENCOUNTER — Ambulatory Visit (INDEPENDENT_AMBULATORY_CARE_PROVIDER_SITE_OTHER): Payer: 59 | Admitting: Orthopaedic Surgery

## 2018-09-13 ENCOUNTER — Ambulatory Visit (INDEPENDENT_AMBULATORY_CARE_PROVIDER_SITE_OTHER): Payer: 59

## 2018-09-13 ENCOUNTER — Other Ambulatory Visit: Payer: Self-pay

## 2018-09-13 VITALS — BP 116/72 | HR 78 | Ht 64.0 in | Wt 130.0 lb

## 2018-09-13 DIAGNOSIS — M25511 Pain in right shoulder: Secondary | ICD-10-CM

## 2018-09-13 DIAGNOSIS — G8929 Other chronic pain: Secondary | ICD-10-CM

## 2018-09-13 MED ORDER — METHYLPREDNISOLONE ACETATE 40 MG/ML IJ SUSP
80.0000 mg | INTRAMUSCULAR | Status: AC | PRN
  Administered 2018-09-13: 80 mg via INTRA_ARTICULAR

## 2018-09-13 MED ORDER — BUPIVACAINE HCL 0.5 % IJ SOLN
2.0000 mL | INTRAMUSCULAR | Status: AC | PRN
  Administered 2018-09-13: 2 mL via INTRA_ARTICULAR

## 2018-09-13 MED ORDER — LIDOCAINE HCL 2 % IJ SOLN
2.0000 mL | INTRAMUSCULAR | Status: AC | PRN
  Administered 2018-09-13: 09:00:00 2 mL

## 2018-09-13 NOTE — Progress Notes (Signed)
Office Visit Note   Patient: Melissa Cantu           Date of Birth: January 01, 1991           MRN: [id number] Visit Date: 09/13/2018              Requested by: Unk Pinto, [address] [place],  [place] [id number] PCP: Unk Pinto, MD   Assessment & Plan: Visit Diagnoses:  1. Chronic right shoulder pain     Plan: Insidious onset right shoulder pain approximately 6 weeks ago.  No injury or trauma, however, Melissa Cantu frequently uses a bone marrow and finds it that causes her to have quite a bit of trouble.  Long discussion regarding possible diagnoses.  Will inject the subacromial space with cortisone to give her a set of shoulder exercises.  Return in 3 to 4 weeks if no improvement.  Does have evidence of impingement.  Follow-Up Instructions: Return if symptoms worsen or fail to improve.   Orders:  Orders Placed This Encounter  Procedures  . Large Joint Inj: R subacromial bursa  . XR Shoulder Right   No orders of the defined types were placed in this encounter.     Procedures: Large Joint Inj: R subacromial bursa on 09/13/2018 9:12 AM Indications: pain and diagnostic evaluation Details: 25 G 1.5 in needle, anterolateral approach  Arthrogram: No  Medications: 2 mL lidocaine 2 %; 2 mL bupivacaine 0.5 %; 80 mg methylPREDNISolone acetate 40 MG/ML Consent was given by the patient. Immediately prior to procedure a time out was called to verify the correct patient, procedure, equipment, support staff and site/side marked as required. Patient was prepped and draped in the usual sterile fashion.       Clinical Data: No additional findings.   Subjective: Chief Complaint  Patient presents with  . Right Shoulder - Pain  Patient presents today for right shoulder pain. No known injury. She said that it started hurting over a month ago. Her pain is located anteriorly. 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 She said that can only lift her arm to a certain  point without pain. Any circular motions are painful. She said that her arm feels weak at times. She has tired icy hot and lidocaine patches.  No loss of motion but shoulder does feel heavy with overhead activity.  No history of shoulder subluxation or dislocation.  Pain is experienced anteriorly.  No numbness or tingling  HPI  Review of Systems  Constitutional: Negative for fatigue.  HENT: Negative for ear pain.   Eyes: Negative for pain.  Respiratory: Positive for shortness of breath.   Cardiovascular: Negative for leg swelling.  Gastrointestinal: Negative for constipation and diarrhea.  Endocrine: Negative for cold intolerance and heat intolerance.  Genitourinary: Negative for difficulty urinating.  Musculoskeletal: Positive for joint swelling.  Skin: Negative for rash.  Allergic/Immunologic: Positive for food allergies.  Neurological: Positive for weakness.  Hematological: Does not bruise/bleed easily.  Psychiatric/Behavioral: Positive for sleep disturbance.     Objective: Vital Signs: BP 116/72   Pulse 78   Ht '5\' 4"'  (1.626 m)   Wt 130 lb (59 kg)   LMP 09/12/2018   BMI 22.31 kg/m   Physical Exam Constitutional:      Appearance: She is well-developed.  Eyes:     Pupils: Pupils are equal, round, and reactive to light.  Pulmonary:     Effort: Pulmonary effort is normal.  Skin:    General: Skin is warm  and dry.  Neurological:     Mental Status: She is alert and oriented to person, place, and time.  Psychiatric:        Behavior: Behavior normal.     Ortho Exam right shoulder full overhead activity.  Full abduction internal and external rotation.  Positive impingement and extreme of external rotation.  Positive empty can testing.  Minimally positive Speed sign.  Does have areas of tenderness along the anterior lateral subacromial space and the AC joint.  Skin intact.  I could feel little crepitation along the subacromial region with internal and external rotation.  Has  hyperextension of the elbow Specialty Comments:  No specialty comments available.  Imaging: Xr Shoulder Right  Result Date: 09/13/2018 Films of the right shoulder obtained in several projections.  Humeral head is centered about the glenoid.  Normal space between the humeral head and the acromion.  No acute changes.  No ectopic calcification.  No degenerative changes    PMFS History: Patient Active Problem List   Diagnosis Date Noted  . History of colonic polyps 06/28/2018  . Hyperlipidemia 03/04/2018  . Lynch syndrome 12/16/2017  . Smoker 09/07/2017  . ADD (attention deficit disorder) 05/31/2017  . Adjustment disorder with mixed anxiety and depressed mood 01/04/2013  . LGSIL on Pap smear of cervix 11/13/2011   Past Medical History:  Diagnosis Date  . 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   Marland Kitchen ADHD (attention deficit hyperactivity disorder)   . Anemia   . Asthma   . Chronic headaches   . Hemorrhoids   . MSH2-related Lynch syndrome (HNPCC1)   . Pneumonia   . UTI (urinary tract infection)     Family History  Problem Relation Age of Onset  . Cancer Sister 52       cervical cancer  . Other Mother   . Colon polyps Mother   . Diabetes Mother   . Hypertension Mother   . Cancer Father        [place]'s  . Breast cancer Maternal Grandmother 60  . Colon cancer Maternal Grandfather 68  . Skin cancer Maternal Grandfather   . Prostate cancer Maternal Grandfather   . Kidney cancer Maternal Grandfather   . Breast cancer Paternal Grandmother   . Parkinson's disease Paternal Grandfather   . Colon cancer Maternal Uncle   . Breast cancer Maternal Aunt   . Esophageal cancer Neg Hx   . Stomach cancer Neg Hx   . Rectal cancer Neg Hx     Past Surgical History:  Procedure Laterality Date  . adnoidectomy    . HERNIA REPAIR     umbillical repair as infant  . TONSILLECTOMY     Social History   Occupational History  .  Not on file  Tobacco Use  . Smoking status: Current Every Day Smoker    Packs/day: 1.00    Types: Cigarettes  . Smokeless tobacco: Never Used  . Tobacco comment: cutting down  Substance and Sexual Activity  . Alcohol use: Yes    Comment: <3 drinks per month  . Drug use: Not Currently    Comment: former marijuana  . Sexual activity: Yes    Partners: Male    Comment: fiance had vasectomy

## 2018-09-13 NOTE — Patient Instructions (Signed)
Shoulder Exercises Ask your health care provider which exercises are safe for you. Do exercises exactly as told by your health care provider and adjust them as directed. It is normal to feel mild stretching, pulling, tightness, or discomfort as you do these exercises. Stop right away if you feel sudden pain or your pain gets worse. Do not begin these exercises until told by your health care provider. Stretching exercises External rotation and abduction This exercise is sometimes called corner stretch. This exercise rotates your arm outward (external rotation) and moves your arm out from your body (abduction). 1. Stand in a doorway with one of your feet slightly in front of the other. This is called a staggered stance. If you cannot reach your forearms to the door frame, stand facing a corner of a room. 2. Choose one of the following positions as told by your health care provider: ? Place your hands and forearms on the door frame above your head. ? Place your hands and forearms on the door frame at the height of your head. ? Place your hands on the door frame at the height of your elbows. 3. Slowly move your weight onto your front foot until you feel a stretch across your chest and in the front of your shoulders. Keep your head and chest upright and keep your abdominal muscles tight. 4. Hold for __________ seconds. 5. To release the stretch, shift your weight to your back foot. Repeat __________ times. Complete this exercise __________ times a day. Extension, standing 1. Stand and hold a broomstick, a cane, or a similar object behind your back. ? Your hands should be a little wider than shoulder width apart. ? Your palms should face away from your back. 2. Keeping your elbows straight and your shoulder muscles relaxed, move the stick away from your body until you feel a stretch in your shoulders (extension). ? Avoid shrugging your shoulders while you move the stick. Keep your shoulder blades tucked  down toward the middle of your back. 3. Hold for __________ seconds. 4. Slowly return to the starting position. Repeat __________ times. Complete this exercise __________ times a day. Range-of-motion exercises Pendulum  1. Stand near a wall or a surface that you can hold onto for balance. 2. Bend at the waist and let your left / right arm hang straight down. Use your other arm to support you. Keep your back straight and do not lock your knees. 3. Relax your left / right arm and shoulder muscles, and move your hips and your trunk so your left / right arm swings freely. Your arm should swing because of the motion of your body, not because you are using your arm or shoulder muscles. 4. Keep moving your hips and trunk so your arm swings in the following directions, as told by your health care provider: ? Side to side. ? Forward and backward. ? In clockwise and counterclockwise circles. 5. Continue each motion for __________ seconds, or for as long as told by your health care provider. 6. Slowly return to the starting position. Repeat __________ times. Complete this exercise __________ times a day. Shoulder flexion, standing  1. Stand and hold a broomstick, a cane, or a similar object. Place your hands a little more than shoulder width apart on the object. Your left / right hand should be palm up, and your other hand should be palm down. 2. Keep your elbow straight and your shoulder muscles relaxed. Push the stick up with your healthy arm to   raise your left / right arm in front of your body, and then over your head until you feel a stretch in your shoulder (flexion). ? Avoid shrugging your shoulder while you raise your arm. Keep your shoulder blade tucked down toward the middle of your back. 3. Hold for __________ seconds. 4. Slowly return to the starting position. Repeat __________ times. Complete this exercise __________ times a day. Shoulder abduction, standing 1. Stand and hold a broomstick,  a cane, or a similar object. Place your hands a little more than shoulder width apart on the object. Your left / right hand should be palm up, and your other hand should be palm down. 2. Keep your elbow straight and your shoulder muscles relaxed. Push the object across your body toward your left / right side. Raise your left / right arm to the side of your body (abduction) until you feel a stretch in your shoulder. ? Do not raise your arm above shoulder height unless your health care provider tells you to do that. ? If directed, raise your arm over your head. ? Avoid shrugging your shoulder while you raise your arm. Keep your shoulder blade tucked down toward the middle of your back. 3. Hold for __________ seconds. 4. Slowly return to the starting position. Repeat __________ times. Complete this exercise __________ times a day. Internal rotation  1. Place your left / right hand behind your back, palm up. 2. Use your other hand to dangle an exercise band, a towel, or a similar object over your shoulder. Grasp the band with your left / right hand so you are holding on to both ends. 3. Gently pull up on the band until you feel a stretch in the front of your left / right shoulder. The movement of your arm toward the center of your body is called internal rotation. ? Avoid shrugging your shoulder while you raise your arm. Keep your shoulder blade tucked down toward the middle of your back. 4. Hold for __________ seconds. 5. Release the stretch by letting go of the band and lowering your hands. Repeat __________ times. Complete this exercise __________ times a day. Strengthening exercises External rotation  1. Sit in a stable chair without armrests. 2. Secure an exercise band to a stable object at elbow height on your left / right side. 3. Place a soft object, such as a folded towel or a small pillow, between your left / right upper arm and your body to move your elbow about 4 inches (10 cm) away  from your side. 4. Hold the end of the exercise band so it is tight and there is no slack. 5. Keeping your elbow pressed against the soft object, slowly move your forearm out, away from your abdomen (external rotation). Keep your body steady so only your forearm moves. 6. Hold for __________ seconds. 7. Slowly return to the starting position. Repeat __________ times. Complete this exercise __________ times a day. Shoulder abduction  1. Sit in a stable chair without armrests, or stand up. 2. Hold a __________ weight in your left / right hand, or hold an exercise band with both hands. 3. Start with your arms straight down and your left / right palm facing in, toward your body. 4. Slowly lift your left / right hand out to your side (abduction). Do not lift your hand above shoulder height unless your health care provider tells you that this is safe. ? Keep your arms straight. ? Avoid shrugging your shoulder while you   do this movement. Keep your shoulder blade tucked down toward the middle of your back. 5. Hold for __________ seconds. 6. Slowly lower your arm, and return to the starting position. Repeat __________ times. Complete this exercise __________ times a day. Shoulder extension 1. Sit in a stable chair without armrests, or stand up. 2. Secure an exercise band to a stable object in front of you so it is at shoulder height. 3. Hold one end of the exercise band in each hand. Your palms should face each other. 4. Straighten your elbows and lift your hands up to shoulder height. 5. Step back, away from the secured end of the exercise band, until the band is tight and there is no slack. 6. Squeeze your shoulder blades together as you pull your hands down to the sides of your thighs (extension). Stop when your hands are straight down by your sides. Do not let your hands go behind your body. 7. Hold for __________ seconds. 8. Slowly return to the starting position. Repeat __________ times.  Complete this exercise __________ times a day. Shoulder row 1. Sit in a stable chair without armrests, or stand up. 2. Secure an exercise band to a stable object in front of you so it is at waist height. 3. Hold one end of the exercise band in each hand. Position your palms so that your thumbs are facing the ceiling (neutral position). 4. Bend each of your elbows to a 90-degree angle (right angle) and keep your upper arms at your sides. 5. Step back until the band is tight and there is no slack. 6. Slowly pull your elbows back behind you. 7. Hold for __________ seconds. 8. Slowly return to the starting position. Repeat __________ times. Complete this exercise __________ times a day. Shoulder press-ups  1. Sit in a stable chair that has armrests. Sit upright, with your feet flat on the floor. 2. Put your hands on the armrests so your elbows are bent and your fingers are pointing forward. Your hands should be about even with the sides of your body. 3. Push down on the armrests and use your arms to lift yourself off the chair. Straighten your elbows and lift yourself up as much as you comfortably can. ? Move your shoulder blades down, and avoid letting your shoulders move up toward your ears. ? Keep your feet on the ground. As you get stronger, your feet should support less of your body weight as you lift yourself up. 4. Hold for __________ seconds. 5. Slowly lower yourself back into the chair. Repeat __________ times. Complete this exercise __________ times a day. Wall push-ups  1. Stand so you are facing a stable wall. Your feet should be about one arm-length away from the wall. 2. Lean forward and place your palms on the wall at shoulder height. 3. Keep your feet flat on the floor as you bend your elbows and lean forward toward the wall. 4. Hold for __________ seconds. 5. Straighten your elbows to push yourself back to the starting position. Repeat __________ times. Complete this exercise  __________ times a day. This information is not intended to replace advice given to you by your health care provider. Make sure you discuss any questions you have with your health care provider. Document Released: 11/26/2004 Document Revised: 05/06/2018 Document Reviewed: 02/11/2018 Elsevier Patient Education  2020 [employer]  

## 2018-10-10 ENCOUNTER — Encounter: Payer: Self-pay | Admitting: Adult Health

## 2019-03-14 ENCOUNTER — Encounter: Payer: Self-pay | Admitting: Adult Health

## 2019-05-16 ENCOUNTER — Other Ambulatory Visit (INDEPENDENT_AMBULATORY_CARE_PROVIDER_SITE_OTHER): Payer: Medicaid Other

## 2019-05-16 ENCOUNTER — Encounter: Payer: Self-pay | Admitting: Gastroenterology

## 2019-05-16 ENCOUNTER — Ambulatory Visit (INDEPENDENT_AMBULATORY_CARE_PROVIDER_SITE_OTHER): Payer: Medicaid Other | Admitting: Gastroenterology

## 2019-05-16 VITALS — BP 108/62 | HR 70 | Temp 98.7°F | Ht 64.0 in | Wt 125.0 lb

## 2019-05-16 DIAGNOSIS — R7989 Other specified abnormal findings of blood chemistry: Secondary | ICD-10-CM | POA: Insufficient documentation

## 2019-05-16 DIAGNOSIS — E876 Hypokalemia: Secondary | ICD-10-CM

## 2019-05-16 DIAGNOSIS — Z1509 Genetic susceptibility to other malignant neoplasm: Secondary | ICD-10-CM | POA: Diagnosis not present

## 2019-05-16 DIAGNOSIS — Z86010 Personal history of colonic polyps: Secondary | ICD-10-CM

## 2019-05-16 DIAGNOSIS — Z8619 Personal history of other infectious and parasitic diseases: Secondary | ICD-10-CM | POA: Insufficient documentation

## 2019-05-16 LAB — HEPATIC FUNCTION PANEL
ALT: 20 U/L (ref 0–35)
AST: 12 U/L (ref 0–37)
Albumin: 4.3 g/dL (ref 3.5–5.2)
Alkaline Phosphatase: 50 U/L (ref 39–117)
Bilirubin, Direct: 0.1 mg/dL (ref 0.0–0.3)
Total Bilirubin: 0.6 mg/dL (ref 0.2–1.2)
Total Protein: 6.4 g/dL (ref 6.0–8.3)

## 2019-05-16 LAB — BASIC METABOLIC PANEL
BUN: 10 mg/dL (ref 6–23)
CO2: 27 mEq/L (ref 19–32)
Calcium: 9.1 mg/dL (ref 8.4–10.5)
Chloride: 108 mEq/L (ref 96–112)
Creatinine, Ser: 0.61 mg/dL (ref 0.40–1.20)
GFR: 118 mL/min (ref >60.00)
Glucose, Bld: 77 mg/dL (ref 70–99)
Potassium: 3.9 mEq/L (ref 3.5–5.1)
Sodium: 140 mEq/L (ref 135–145)

## 2019-05-16 NOTE — Patient Instructions (Addendum)
If you are age 27 or older, your body mass index should be between 23-30. Your Body mass index is 21.46 kg/m. If this is out of the aforementioned range listed, please consider follow up with your Primary Care Provider.  If you are age 35 or younger, your body mass index should be between 19-25. Your Body mass index is 21.46 kg/m. If this is out of the aformentioned range listed, please consider follow up with your Primary Care Provider.   Your provider has requested that you go to the basement level for lab work before leaving today. Press "B" on the elevator. The lab is located at the first door on the left as you exit the elevator.  Start Probiotic daily (Florastor, VSL #3, Electronics engineer).  You have been scheduled for an endoscopy and colonoscopy. Please follow the written instructions given to you at your visit today. Please pick up your prep supplies at the pharmacy within the next 1-3 days. If you use inhalers (even only as needed), please bring them with you on the day of your procedure.

## 2019-05-16 NOTE — Progress Notes (Signed)
Addendum: Reviewed and agree with assessment and management plan. Palak Tercero M, MD  

## 2019-05-16 NOTE — Progress Notes (Signed)
05/16/2019 Melissa Cantu [id number] 1990/02/17   HISTORY OF PRESENT ILLNESS: This is a 29 year old female is a patient of Dr. Vena Rua.  She has Lynch syndrome.  Last EGD and colonoscopy 1 year ago.  Colonoscopy revealed 1 polyp that was removed and was a sessile serrated polyp.  EGD was normal, but gastric biopsies showed H. pylori organisms.  This was then treated and subsequent stool antigen was negative.  Repeat EGD and colonoscopy were both recommended at 1 year interval.  She has also been sent here today for evaluation regarding elevated LFTs.  She told me that on March 30 she had sudden onset of nausea, vomiting, diarrhea, and abdominal pain.  She was evaluated and CT scan of the abdomen and pelvis with contrast showed nondistended fluid-filled loops of the large and small bowel wall with mild prominence of bowel wall thickness suggestive of diarrheal illness such as enteritis or enterocolitis.  Labs showed a one-time elevation of ALT at 100.  Other LFTs normal.  Potassium was very low last it was checked at 2.8 as well.  WBC count was elevated at 20.7, at the time of her illness but a week later had normalized.  She says that overall she feels much better.  Her abdomen is still little bit sore and her stools just returned to normal again yesterday and today.  Acute viral hepatitis panel was negative.   Past Medical History:  Diagnosis Date  . 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   Marland Kitchen ADHD (attention deficit hyperactivity disorder)   . Anemia   . Asthma   . Chronic headaches   . Hemorrhoids   . MSH2-related Lynch syndrome (HNPCC1)   . Pneumonia   . UTI (urinary tract infection)    Past Surgical History:  Procedure Laterality Date  . adnoidectomy    . HERNIA REPAIR     umbillical repair as infant  . TONSILLECTOMY      reports that she has been smoking cigarettes. She has been smoking about 1.00 pack per day. She has  never used smokeless tobacco. She reports current alcohol use. She reports previous drug use. family history includes Breast cancer in her maternal aunt and paternal grandmother; Breast cancer (age of onset: 24) in her maternal grandmother; Cancer in her father; Cancer (age of onset: 5) in her sister; Colon cancer in her maternal uncle; Colon cancer (age of onset: 15) in her maternal grandfather; Colon polyps in her mother; Diabetes in her mother; Hypertension in her mother; Kidney cancer in her maternal grandfather; Other in her mother; Parkinson's disease in her paternal grandfather; Prostate cancer in her maternal grandfather; Skin cancer in her maternal grandfather. Allergies  Allergen Reactions  . Banana Itching  . Citrullus Vulgaris Itching    All melons       Outpatient Encounter Medications as of 05/16/2019  Medication Sig  . ALPRAZolam (XANAX) 0.5 MG tablet 1/2-1 tablet as needed daily for anxiety  . [DISCONTINUED] albuterol (PROVENTIL HFA;VENTOLIN HFA) 108 (90 Base) MCG/ACT inhaler Use 1 2 inhalations 15 minutes apart every 4 hours if needed to rescue Asthma  . [DISCONTINUED] Levocetirizine Dihydrochloride (XYZAL PO) Take by mouth.  . [DISCONTINUED] lisdexamfetamine (VYVANSE) 50 MG capsule Take 1 tab by mouth as needed on days that you work only. Script should ~4 months.  . [DISCONTINUED] omeprazole (PRILOSEC) 20 MG capsule Take 1 capsule (20 mg total) by mouth 2 (two) times daily before  a meal.   No facility-administered encounter medications on file as of 05/16/2019.     REVIEW OF SYSTEMS  : All other systems reviewed and negative except where noted in the History of Present Illness.   PHYSICAL EXAM: BP 108/62   Pulse 70   Temp 98.7 F (37.1 C)   Ht _0  (1.626 m)   Wt 125 lb (56.7 kg)   BMI 21.46 kg/m  General: Well developed white female in no acute distress Head: Normocephalic and atraumatic Eyes:  Sclerae anicteric, conjunctiva pink. Ears: Normal auditory  acuity Lungs: Clear throughout to auscultation; no increased WOB. Heart: Regular rate and rhythm; no M/R/G. Abdomen: Soft, non-distended.  BS present.  Non-tender. Rectal:  Will be done at the time of colonoscopy Musculoskeletal: Symmetrical with no gross deformities  Skin: No lesions on visible extremities Extremities: No edema  Neurological: Alert oriented x 4, grossly non-focal Psychological:  Alert and cooperative. Normal mood and affect  ASSESSMENT AND PLAN: *Lynch syndrome: Due for repeat colonoscopy and Dr. Hilarie Fredrickson wanted to repeat endoscopy as well due to previous H. pylori infection.  We will schedule both of those procedures with Dr. Hilarie Fredrickson.  The risks, benefits, and alternatives to EGD and colonoscopy were discussed with the patient and she consents to proceed.  *Elevated LFTs: One-time elevation with an ALT of 100, other LFTs normal.  This is in the setting of acute gastroenteritis type illness.  I think it was likely reactive.  We will repeat LFT"s today. *Hypokalemia: Potassium was low on last labs in the setting of acute gastroenteritis type illness.  We will repeat BMP today. *Gastroenteritis with sudden onset nausea, vomiting, diarrhea, abdominal pain that began on March 30: Symptoms pretty well resolved at this point, but just began having solid stools again the last couple of days.  Abdomen still sore.  Advised to begin taking a daily probiotic for the next few weeks.  CC:  No ref. provider found

## 2019-06-19 ENCOUNTER — Telehealth: Payer: Self-pay | Admitting: Gastroenterology

## 2019-06-19 MED ORDER — SUTAB 1479-225-188 MG PO TABS: 1.0000 | ORAL_TABLET | Freq: Once | ORAL | 0 refills | Status: AC

## 2019-06-19 NOTE — Telephone Encounter (Signed)
Called to let her know would call in script for Suprep and she requested a new prep to be sent in that taste better. Sent in [place] and sent patient new instructions via mychart. Told patient to call the office if she had any problems picking up S[place]ab.

## 2019-06-19 NOTE — Telephone Encounter (Signed)
Patient has procedure coming up and has not received her prep medication

## 2019-06-20 ENCOUNTER — Other Ambulatory Visit: Payer: Self-pay | Admitting: Internal Medicine

## 2019-06-20 ENCOUNTER — Ambulatory Visit (INDEPENDENT_AMBULATORY_CARE_PROVIDER_SITE_OTHER): Payer: Medicaid Other

## 2019-06-20 DIAGNOSIS — Z1159 Encounter for screening for other viral diseases: Secondary | ICD-10-CM

## 2019-06-20 LAB — SARS CORONAVIRUS 2 (TAT 6-24 HRS): SARS Coronavirus 2: NEGATIVE

## 2019-06-21 ENCOUNTER — Telehealth: Payer: Self-pay | Admitting: Gastroenterology

## 2019-06-21 NOTE — Telephone Encounter (Signed)
Patient has questions about her prep schedule for 06/22/19

## 2019-06-21 NOTE — Telephone Encounter (Signed)
Spoke with pt and she wanted to know if she could have bread until she took her first prep pills at 6pm. Reviewed instructions with pt over the phone and let her know she is to have clear liquids only until 6pm. Pt verbalized understanding.

## 2019-06-22 ENCOUNTER — Other Ambulatory Visit: Payer: Self-pay

## 2019-06-22 ENCOUNTER — Ambulatory Visit (AMBULATORY_SURGERY_CENTER): Payer: Medicaid Other | Admitting: Internal Medicine

## 2019-06-22 ENCOUNTER — Encounter: Payer: Self-pay | Admitting: Internal Medicine

## 2019-06-22 VITALS — BP 99/62 | HR 61 | Temp 98.4°F | Resp 13 | Ht 64.0 in | Wt 125.0 lb

## 2019-06-22 DIAGNOSIS — Z8619 Personal history of other infectious and parasitic diseases: Secondary | ICD-10-CM | POA: Diagnosis not present

## 2019-06-22 DIAGNOSIS — K2900 Acute gastritis without bleeding: Secondary | ICD-10-CM

## 2019-06-22 DIAGNOSIS — D125 Benign neoplasm of sigmoid colon: Secondary | ICD-10-CM

## 2019-06-22 DIAGNOSIS — Z86010 Personal history of colonic polyps: Secondary | ICD-10-CM

## 2019-06-22 DIAGNOSIS — Z1509 Genetic susceptibility to other malignant neoplasm: Secondary | ICD-10-CM

## 2019-06-22 DIAGNOSIS — K635 Polyp of colon: Secondary | ICD-10-CM | POA: Diagnosis not present

## 2019-06-22 DIAGNOSIS — K2950 Unspecified chronic gastritis without bleeding: Secondary | ICD-10-CM

## 2019-06-22 MED ORDER — SODIUM CHLORIDE 0.9 % IV SOLN: 500.0000 mL | Freq: Once | INTRAVENOUS | Status: DC

## 2019-06-22 NOTE — Op Note (Signed)
[facility] Patient Name: Melissa Cantu Procedure Date: 06/22/2019 10:29 AM MRN: [id number] Endoscopist: Jerene Bears , MD Age: 29 Referring MD:  Date of Birth: 08-Jan-1991 Gender: Female Account #: [id number] Procedure:                Upper GI endoscopy Indications:              Hereditary nonpolyposis colorectal cancer (Lynch                            Syndrome); history of H. Pylori diagnosed 1 year                            ago at EGD s/p antibiotic treatment Medicines:                Monitored Anesthesia Care Procedure:                Pre-Anesthesia Assessment:                           - Prior to the procedure, a History and Physical                            was performed, and patient medications and                            allergies were reviewed. The patient's tolerance of                            previous anesthesia was also reviewed. The risks                            and benefits of the procedure and the sedation                            options and risks were discussed with the patient.                            All questions were answered, and informed consent                            was obtained. Prior Anticoagulants: The patient has                            taken no previous anticoagulant or antiplatelet                            agents. ASA Grade Assessment: II - A patient with                            mild systemic disease. After reviewing the risks                            and benefits, the patient was deemed in  satisfactory condition to undergo the procedure.                           After obtaining informed consent, the endoscope was                            passed under direct vision. Throughout the                            procedure, the patient's blood pressure, pulse, and                            oxygen saturations were monitored continuously. The                            Endoscope was  introduced through the mouth, and                            advanced to the second part of duodenum. The upper                            GI endoscopy was accomplished without difficulty.                            The patient tolerated the procedure well. Scope In: Scope Out: Findings:                 The examined esophagus was normal.                           The cardia, gastric fundus and gastric body were                            normal.                           Severe inflammation characterized by erythema,                            granularity and shallow ulcerations was found in                            the gastric antrum. Biopsies were taken with a cold                            forceps for histology and Helicobacter pylori                            testing.                           The examined duodenum was normal. Complications:            No immediate complications. Estimated Blood Loss:     Estimated blood loss was minimal. Impression:               -  Normal esophagus.                           - Normal cardia, gastric fundus and gastric body.                           - Gastritis. Biopsied.                           - Normal examined duodenum. Recommendation:           - Patient has a contact number available for                            emergencies. The signs and symptoms of potential                            delayed complications were discussed with the                            patient. Return to normal activities tomorrow.                            Written discharge instructions were provided to the                            patient.                           - Resume previous diet.                           - Continue present medications.                           - Await pathology results. Jerene Bears, MD 06/22/2019 11:05:21 AM This report has been signed electronically.

## 2019-06-22 NOTE — Patient Instructions (Signed)
YOU HAD AN ENDOSCOPIC PROCEDURE TODAY AT THE [facility]:   Refer to the procedure report that was given to you for any specific questions about what was found during the examination.  If the procedure report does not answer your questions, please call your gastroenterologist to clarify.  If you requested that your care partner not be given the details of your procedure findings, then the procedure report has been included in a sealed envelope for you to review at your convenience later.  YOU SHOULD EXPECT: Some feelings of bloating in the abdomen. Passage of more gas than usual.  Walking can help get rid of the air that was put into your GI tract during the procedure and reduce the bloating. If you had a lower endoscopy (such as a colonoscopy or flexible sigmoidoscopy) you may notice spotting of blood in your stool or on the toilet paper. If you underwent a bowel prep for your procedure, you may not have a normal bowel movement for a few days.  Please Note:  You might notice some irritation and congestion in your nose or some drainage.  This is from the oxygen used during your procedure.  There is no need for concern and it should clear up in a day or so.  SYMPTOMS TO REPORT IMMEDIATELY:   Following lower endoscopy (colonoscopy or flexible sigmoidoscopy):  Excessive amounts of blood in the stool  Significant tenderness or worsening of abdominal pains  Swelling of the abdomen that is new, acute  Fever of 100F or higher   Following upper endoscopy (EGD)  Vomiting of blood or coffee ground material  New chest pain or pain under the shoulder blades  Painful or persistently difficult swallowing  New shortness of breath  Fever of 100F or higher  Black, tarry-looking stools  For urgent or emergent issues, a gastroenterologist can be reached at any hour by calling [phone]. Do not use MyChart messaging for urgent concerns.    DIET:  We do recommend a small meal at first, but  then you may proceed to your regular diet.  Drink plenty of fluids but you should avoid alcoholic beverages for 24 hours.  MEDICATIONS: Continue present medications.  Please see handouts given to you by your recovery nurse.  ACTIVITY:  You should plan to take it easy for the rest of today and you should NOT DRIVE or use heavy machinery until tomorrow (because of the sedation medicines used during the test).    FOLLOW UP: Our staff will call the number listed on your records 48-72 hours following your procedure to check on you and address any questions or concerns that you may have regarding the information given to you following your procedure. If we do not reach you, we will leave a message.  We will attempt to reach you two times.  During this call, we will ask if you have developed any symptoms of COVID 19. If you develop any symptoms (ie: fever, flu-like symptoms, shortness of breath, cough etc.) before then, please call [phone].  If you test positive for Covid 19 in the 2 weeks post procedure, please call and report this information to us.    If any biopsies were taken you will be contacted by phone or by letter within the next 1-3 weeks.  Please call us at [phone] if you have not heard about the biopsies in 3 weeks.   Thank you for allowing us to provide for your healthcare needs today.   SIGNATURES/CONFIDENTIALITY: You   and/or your care partner have signed paperwork which will be entered into your electronic medical record.  These signatures attest to the fact that that the information above on your After Visit Summary has been reviewed and is understood.  Full responsibility of the confidentiality of this discharge information lies with you and/or your care-partner.

## 2019-06-22 NOTE — Progress Notes (Signed)
Pt's states no medical or surgical changes since previsit or office visit. 

## 2019-06-22 NOTE — Op Note (Signed)
[facility] Patient Name: Melissa Cantu Procedure Date: 06/22/2019 10:29 AM MRN: [id number] Endoscopist: Jerene Bears , MD Age: 29 Referring MD:  Date of Birth: 08-Sep-1990 Gender: Female Account #: [id number] Procedure:                Colonoscopy Indications:              Last colonoscopy 1 year ago, Lynch Syndrome,                            personal history of sessile serrated polyp at last                            exam (2020) Medicines:                Monitored Anesthesia Care Procedure:                Pre-Anesthesia Assessment:                           - Prior to the procedure, a History and Physical                            was performed, and patient medications and                            allergies were reviewed. The patient's tolerance of                            previous anesthesia was also reviewed. The risks                            and benefits of the procedure and the sedation                            options and risks were discussed with the patient.                            All questions were answered, and informed consent                            was obtained. Prior Anticoagulants: The patient has                            taken no previous anticoagulant or antiplatelet                            agents. ASA Grade Assessment: II - A patient with                            mild systemic disease. After reviewing the risks                            and benefits, the patient was deemed in  satisfactory condition to undergo the procedure.                           After obtaining informed consent, the colonoscope                            was passed under direct vision. Throughout the                            procedure, the patient's blood pressure, pulse, and                            oxygen saturations were monitored continuously. The                            Colonoscope was introduced through the anus and                         advanced to the terminal ileum. The colonoscopy was                            performed without difficulty. The patient tolerated                            the procedure well. The quality of the bowel                            preparation was good. Scope In: 10:42:43 AM Scope Out: 10:59:17 AM Scope Withdrawal Time: 0 hours 14 minutes 44 seconds  Total Procedure Duration: 0 hours 16 minutes 34 seconds  Findings:                 The terminal ileum appeared normal.                           A 3 mm polyp was found in the sigmoid colon. The                            polyp was sessile. The polyp was removed with a                            cold snare. Resection and retrieval were complete.                           The exam was otherwise without abnormality on                            direct and retroflexion views. Complications:            No immediate complications. Estimated Blood Loss:     Estimated blood loss was minimal. Impression:               - The examined portion of the ileum was normal.                           -  One 3 mm polyp in the sigmoid colon, removed with                            a cold snare. Resected and retrieved.                           - The examination was otherwise normal on direct                            and retroflexion views. Recommendation:           - Patient has a contact number available for                            emergencies. The signs and symptoms of potential                            delayed complications were discussed with the                            patient. Return to normal activities tomorrow.                            Written discharge instructions were provided to the                            patient.                           - Resume previous diet.                           - Continue present medications.                           - Await pathology results.                           - Repeat  colonoscopy in 1 year for surveillance. Jerene Bears, MD 06/22/2019 11:08:07 AM This report has been signed electronically.

## 2019-06-22 NOTE — Progress Notes (Signed)
Report to PACU, RN, vss, BBS= Clear.  

## 2019-06-27 ENCOUNTER — Telehealth: Payer: Self-pay

## 2019-06-27 NOTE — Telephone Encounter (Signed)
  Follow up Call-  Call back number 06/22/2019 06/23/2018  Post procedure Call Back phone  # [phone]  Permission to leave phone message Yes Yes  Some recent data might be hidden     Patient questions:  Do you have a fever, pain , or abdominal swelling? No. Pain Score  0 *  Have you tolerated food without any problems? Yes.    Have you been able to return to your normal activities? Yes.    Do you have any questions about your discharge instructions: Diet   No. Medications  No. Follow up visit  No.  Do you have questions or concerns about your Care? No.  Actions: * If pain score is 4 or above: No action needed, pain <4.  1. Have you developed a fever since your procedure? no  2.   Have you had an respiratory symptoms (SOB or cough) since your procedure? no  3.   Have you tested positive for COVID 19 since your procedure no  4.   Have you had any family members/close contacts diagnosed with the COVID 19 since your procedure?  no   If yes to any of these questions please route to Joylene John, RN and Erenest Rasher, RN

## 2019-07-02 ENCOUNTER — Encounter: Payer: Self-pay | Admitting: Internal Medicine

## 2020-04-03 ENCOUNTER — Emergency Department (HOSPITAL_COMMUNITY): Payer: Medicaid Other

## 2020-04-03 ENCOUNTER — Other Ambulatory Visit: Payer: Self-pay

## 2020-04-03 ENCOUNTER — Emergency Department (HOSPITAL_COMMUNITY)
Admission: EM | Admit: 2020-04-03 | Discharge: 2020-04-03 | Disposition: A | Discharge status: Home / Self Care | Payer: Medicaid Other | Attending: Emergency Medicine | Admitting: Emergency Medicine

## 2020-04-03 ENCOUNTER — Encounter (HOSPITAL_COMMUNITY): Payer: Self-pay | Admitting: Emergency Medicine

## 2020-04-03 DIAGNOSIS — F17210 Nicotine dependence, cigarettes, uncomplicated: Secondary | ICD-10-CM | POA: Diagnosis not present

## 2020-04-03 DIAGNOSIS — K529 Noninfective gastroenteritis and colitis, unspecified: Secondary | ICD-10-CM | POA: Diagnosis not present

## 2020-04-03 DIAGNOSIS — J45909 Unspecified asthma, uncomplicated: Secondary | ICD-10-CM | POA: Insufficient documentation

## 2020-04-03 DIAGNOSIS — R109 Unspecified abdominal pain: Secondary | ICD-10-CM | POA: Diagnosis present

## 2020-04-03 LAB — I-STAT BETA HCG BLOOD, ED (MC, WL, AP ONLY): I-stat hCG, quantitative: <5 m[IU]/mL (ref <5)

## 2020-04-03 LAB — URINALYSIS, ROUTINE W REFLEX MICROSCOPIC
Bilirubin Urine: NEGATIVE
Glucose, UA: NEGATIVE mg/dL
Ketones, ur: 20 mg/dL — AB
Leukocytes,Ua: NEGATIVE
Nitrite: NEGATIVE
Protein, ur: NEGATIVE mg/dL
Specific Gravity, Urine: 1.025 (ref 1.005–1.030)
pH: 5 (ref 5.0–8.0)

## 2020-04-03 LAB — CBC
HCT: 42.5 % (ref 36.0–46.0)
Hemoglobin: 14 g/dL (ref 12.0–15.0)
MCH: 29.5 pg (ref 26.0–34.0)
MCHC: 32.9 g/dL (ref 30.0–36.0)
MCV: 89.7 fL (ref 80.0–100.0)
Platelets: 292 10*3/uL (ref 150–400)
RBC: 4.74 MIL/uL (ref 3.87–5.11)
RDW: 12.8 % (ref 11.5–15.5)
WBC: 17.4 10*3/uL — ABNORMAL HIGH (ref 4.0–10.5)
nRBC: 0 % (ref 0.0–0.2)

## 2020-04-03 LAB — COMPREHENSIVE METABOLIC PANEL
ALT: 13 U/L (ref 0–44)
AST: 16 U/L (ref 15–41)
Albumin: 4.6 g/dL (ref 3.5–5.0)
Alkaline Phosphatase: 58 U/L (ref 38–126)
Anion gap: 11 (ref 5–15)
BUN: 12 mg/dL (ref 6–20)
CO2: 18 mmol/L — ABNORMAL LOW (ref 22–32)
Calcium: 9.8 mg/dL (ref 8.9–10.3)
Chloride: 109 mmol/L (ref 98–111)
Creatinine, Ser: 0.71 mg/dL (ref 0.44–1.00)
GFR, Estimated: >60 mL/min (ref >60)
Glucose, Bld: 89 mg/dL (ref 70–99)
Potassium: 3.7 mmol/L (ref 3.5–5.1)
Sodium: 138 mmol/L (ref 135–145)
Total Bilirubin: 1.1 mg/dL (ref 0.3–1.2)
Total Protein: 7.6 g/dL (ref 6.5–8.1)

## 2020-04-03 LAB — LIPASE, BLOOD: Lipase: 29 U/L (ref 11–51)

## 2020-04-03 MED ORDER — SODIUM CHLORIDE 0.9 % IV BOLUS
1000.0000 mL | Freq: Once | INTRAVENOUS | Status: AC
  Administered 2020-04-03: 1000 mL via INTRAVENOUS

## 2020-04-03 MED ORDER — ONDANSETRON 4 MG PO TBDP: 4.0000 mg | ORAL_TABLET | Freq: Three times a day (TID) | ORAL | 0 refills | Status: DC | PRN

## 2020-04-03 MED ORDER — FENTANYL CITRATE (PF) 100 MCG/2ML IJ SOLN
50.0000 ug | Freq: Once | INTRAMUSCULAR | Status: AC
Start: 2020-04-03 — End: 2020-04-03
  Administered 2020-04-03: 50 ug via INTRAVENOUS
  Filled 2020-04-03: qty 2

## 2020-04-03 MED ORDER — IOHEXOL 300 MG/ML  SOLN
100.0000 mL | Freq: Once | INTRAMUSCULAR | Status: AC | PRN
  Administered 2020-04-03: 100 mL via INTRAVENOUS

## 2020-04-03 MED ORDER — ONDANSETRON HCL 4 MG/2ML IJ SOLN
4.0000 mg | Freq: Once | INTRAMUSCULAR | Status: AC
  Administered 2020-04-03: 4 mg via INTRAVENOUS
  Filled 2020-04-03: qty 2

## 2020-04-03 NOTE — Discharge Instructions (Signed)
Your work-up today was reassuring, as we discussed you most likely have gastroenteritis from a viral infection.  Please stay hydrated, use the attached instructions.I  also prescribed you Zofran this is a nausea medication which will help with your vomiting.  Use the food choices to help relieve diarrhea attachment.  Take Tylenol as directed on the bottle for pain.  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  If you have any new worsening concerning symptom please come back to the emergency department.  Please follow-up with your primary care in the next couple of days.  Get help right away if: You have chest pain. You feel very weak. You pass out (faint). You see blood in your throw-up. Your throw-up looks like coffee grounds. You have bloody or black poop (stools) or poop that looks like tar. You have a very bad headache, or a stiff neck, or both. You have a rash. You have very bad pain, cramping, or bloating in your belly. You have trouble breathing. You are breathing very quickly. You have a fast heartbeat. Your skin feels cold and clammy. You feel mixed up (confused). You have pain when you pee. You have signs of not having enough water in the body, such as: Dark pee, hardly any pee, or no pee. Cracked lips. Dry mouth. Sunken eyes. Feeling very sleepy. Feeling weak.

## 2020-04-03 NOTE — ED Notes (Signed)
Discharge instructions including prescription and follow up care discussed with pt. Pt verbalized understanding with no questions at this time. Pt to go home with family member at bedside. Ambulatory at discharge.

## 2020-04-03 NOTE — ED Triage Notes (Signed)
Patient complains of abdominal cramping and vomiting that started a few days ago. Patient alert, oriented, and in on apparent distress at this time.

## 2020-04-03 NOTE — ED Provider Notes (Signed)
[facility] EMERGENCY DEPARTMENT Provider Note   CSN: [id number] Arrival date & time: 04/03/20  1517     History Chief Complaint  Patient presents with  . Abdominal Pain    Melissa Cantu is a 30 y.o. female with pertinent past medical history of ADHD, anemia, asthma, Lynch syndrome that presents the emergency department today for abdominal pain nausea vomiting diarrhea that started today.  Patient states that her son was diagnosed 2 days with a viral gastroenteritis its been going around his school, states that she thinks that she also has this virus.  States that her abdomen is super tender to touch all over.  States that she has not been able to eat or drink anything, not able to tolerate p.o. due to vomiting.  States that vomit is brown in color, no hematemesis.  Diarrhea without any blood.  States that she has already been twice while she has been here.  Denies any fevers or chills.  Denies any chest pain or shortness of breath.  Patient states that she took some ODT Zofran earlier which did help.  Denies any dysuria or hematuria, patient states that she is currently on her menstrual cycle.  Denies any chance of pregnancy or STDs, no vaginal bleeding or vaginal discharge.  No other complaints. HPI     Past Medical History:  Diagnosis Date  . 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   Marland Kitchen ADHD (attention deficit hyperactivity disorder)   . Anemia   . Asthma   . Chronic headaches   . Hemorrhoids   . MSH2-related Lynch syndrome (HNPCC1)   . Pneumonia   . UTI (urinary tract infection)     Patient Active Problem List   Diagnosis Date Noted  . Elevated LFTs 05/16/2019  . History of Helicobacter pylori infection 05/16/2019  . Hypokalemia 05/16/2019  . History of colonic polyps 06/28/2018  . Hyperlipidemia 03/04/2018  . Lynch syndrome 12/16/2017  . Smoker 09/07/2017  . ADD (attention deficit disorder)  05/31/2017  . Adjustment disorder with mixed anxiety and depressed mood 01/04/2013  . 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 adnoidectomy    . HERNIA REPAIR     umbillical repair as infant  . TONSILLECTOMY       OB History   No obstetric history on file.     Family History  Problem Relation Age of Onset  . Cancer Sister 2       cervical cancer  . Other Mother   . Colon polyps Mother   . Diabetes Mother   . Hypertension Mother   . Cancer Father        [place]'s  . Breast cancer Maternal Grandmother 29  . Colon cancer Maternal Grandfather 67  . Skin cancer Maternal Grandfather   . Prostate cancer Maternal Grandfather   . Kidney cancer Maternal Grandfather   . Breast cancer Paternal Grandmother   . Parkinson's disease Paternal Grandfather   . Colon cancer Maternal Uncle   . Breast cancer Maternal Aunt   . Esophageal cancer Neg Hx   . Stomach cancer Neg Hx   . Rectal cancer Neg Hx     Social History   Tobacco Use  . Smoking status: Current Every Day Smoker    Packs/day: 1.00    Types: Cigarettes  . Smokeless tobacco: Never Used  . Tobacco comment: cutting  down  Vaping Use  . Vaping Use: Some days  Substance Use Topics  . Alcohol use: Yes    Comment: <3 drinks per month  . Drug use: Not Currently    Comment: former marijuana    Home Medications Prior to Admission medications   Medication Sig Start Date End Date Taking? Authorizing Provider  ondansetron (ZOFRAN ODT) 4 MG disintegrating tablet Take 1 tablet (4 mg total) by mouth every 8 (eight) hours as needed for nausea or vomiting. 04/03/20  Yes Alfredia Client, PA-C  ALPRAZolam Duanne Moron) 0.5 MG tablet 1/2-1 tablet as needed daily for anxiety 03/03/18   Liane Comber, NP    Allergies    Banana and Citrullus vulgaris  Review of Systems   Review of Systems  Constitutional: Negative for chills, diaphoresis, fatigue and fever.  HENT: Negative for  congestion, sore throat and trouble swallowing.   Eyes: Negative for pain and visual disturbance.  Respiratory: Negative for cough, shortness of breath and wheezing.   Cardiovascular: Negative for chest pain, palpitations and leg swelling.  Gastrointestinal: Positive for abdominal pain, nausea and vomiting. Negative for abdominal distention and diarrhea.  Genitourinary: Negative for difficulty urinating.  Musculoskeletal: Negative for back pain, neck pain and neck stiffness.  Skin: Negative for pallor.  Neurological: Negative for dizziness, speech difficulty, weakness and headaches.  Psychiatric/Behavioral: Negative for confusion.    Physical Exam Updated Vital Signs BP 110/74   Pulse 80   Temp 98.4 F (36.9 C)   Resp (!) 22   SpO2 100%   Physical Exam Constitutional:      General: She is not in acute distress.    Appearance: Normal appearance. She is not ill-appearing, toxic-appearing or diaphoretic.  HENT:     Mouth/Throat:     Mouth: Mucous membranes are moist.     Pharynx: Oropharynx is clear.  Eyes:     General: No scleral icterus.    Extraocular Movements: Extraocular movements intact.     Pupils: Pupils are equal, round, and reactive to light.  Cardiovascular:     Rate and Rhythm: Normal rate and regular rhythm.     Pulses: Normal pulses.     Heart sounds: Normal heart sounds.  Pulmonary:     Effort: Pulmonary effort is normal. No respiratory distress.     Breath sounds: Normal breath sounds. No stridor. No wheezing, rhonchi or rales.  Chest:     Chest wall: No tenderness.  Abdominal:     General: Abdomen is flat. There is no distension.     Palpations: Abdomen is soft.     Tenderness: There is generalized abdominal tenderness. There is guarding. There is no rebound.       Comments: Generalized abdominal pain with guarding throughout.  Musculoskeletal:        General: No swelling or tenderness. Normal range of motion.     Cervical back: Normal range of motion  and neck supple. No rigidity.     Right lower leg: No edema.     Left lower leg: No edema.  Skin:    General: Skin is warm and dry.     Capillary Refill: Capillary refill takes less than 2 seconds.     Coloration: Skin is not pale.  Neurological:     General: No focal deficit present.     Mental Status: She is alert and oriented to person, place, and time.  Psychiatric:        Mood and Affect: Mood normal.  Behavior: Behavior normal.     ED Results / Procedures / Treatments   Labs (all labs ordered are listed, but only abnormal results are displayed) Labs Reviewed  COMPREHENSIVE METABOLIC PANEL - Abnormal; Notable for the following components:      Result Value   CO2 18 (*)    All other components within normal limits  CBC - Abnormal; Notable for the following components:   WBC 17.4 (*)    All other components within normal limits  URINALYSIS, ROUTINE W REFLEX MICROSCOPIC - Abnormal; Notable for the following components:   Color, Urine AMBER (*)    APPearance TURBID (*)    Hgb urine dipstick SMALL (*)    Ketones, ur 20 (*)    Bacteria, UA RARE (*)    All other components within normal limits  LIPASE, BLOOD  I-STAT BETA HCG BLOOD, ED ([facility], [facility], [facility] ONLY)    EKG None  Radiology CT Abdomen Pelvis W Contrast  Result Date: 04/03/2020 CLINICAL DATA:  Abdominal pain with cramping.  Vomiting. EXAM: CT ABDOMEN AND PELVIS WITH CONTRAST TECHNIQUE: Multidetector CT imaging of the abdomen and pelvis was performed using the standard protocol following bolus administration of intravenous contrast. CONTRAST:  100mL OMNIPAQUE IOHEXOL 300 MG/ML  SOLN COMPARISON:  None. FINDINGS: Lower chest: The lung bases are clear. Hepatobiliary: No focal liver abnormality is seen. No gallstones, gallbladder wall thickening, or biliary dilatation. Pancreas: Unremarkable. No pancreatic ductal dilatation or surrounding inflammatory changes. Spleen: Normal in size without focal abnormality.  Adrenals/Urinary Tract: Normal adrenal glands. No hydronephrosis or perinephric edema. Homogeneous renal enhancement. Tiny cortical hypodensity in the upper right kidney is too small to accurately characterize. Urinary bladder is partially distended without wall thickening. Stomach/Bowel: Bowel evaluation is limited in the absence of enteric contrast. Fluid/ingested material distends the stomach. No gastric wall thickening. No bowel obstruction. Pelvic bowel loops are fluid-filled but nondilated, equivocal wall hyperemia. No mesenteric edema or fat stranding. No terminal ileal inflammation. Appendix is not confidently visualized, no evidence of appendicitis. Small volume of stool in the right colon. Descending and sigmoid colon are decompressed. There is minimal wall thickening and mucosal hyperemia the rectosigmoid colon. Vascular/Lymphatic: Normal caliber abdominal aorta. Patent portal vein. No abdominopelvic adenopathy. Reproductive: Unremarkable uterus. Tampon in the vagina. Ovaries are symmetric in size. No adnexal mass. Other: No free air, free fluid, or intra-abdominal fluid collection. Musculoskeletal: There are no acute or suspicious osseous abnormalities. Hemi transitional lumbosacral anatomy with enlarged left transverse process and pseudoarticulation with the sacrum. IMPRESSION: 1. Minimal wall thickening and mucosal hyperemia of the rectosigmoid colon, suggesting mild colitis. 2. Fluid-filled but nondilated pelvic bowel loops with equivocal wall hyperemia, may be reactive or can be seen with enteritis. No obstruction or perforation. 3. 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 Recommend correlation for any history of back pain. Electronically Signed   By: Melanie  Sanford M.D.   On: 04/03/2020 21:01    Procedures Procedures   Medications Ordered  in ED Medications  sodium chloride 0.9 % bolus 1,000 mL (0 mLs Intravenous Stopped 04/03/20 2041)  ondansetron (ZOFRAN) injection 4 mg (4 mg Intravenous Given 04/03/20 1929)  fentaNYL (SUBLIMAZE) injection 50 mcg (50 mcg Intravenous Given 04/03/20 1930)  sodium chloride 0.9 % bolus 1,000 mL (1,000 mLs Intravenous New Bag/Given 04/03/20 2041)  iohexol (OMNIPAQUE) 300 MG/ML solution 100 mL (100 mLs Intravenous Contrast Given 04/03/20 2044)      ED Course  I have reviewed the triage vital signs and the nursing notes.  Pertinent labs & imaging results that were available during my care of the patient were reviewed by me and considered in my medical decision making (see chart for details).    MDM Rules/Calculators/A&P                          Lexi Blough is a 29 y.o. female with pertinent past medical history of ADHD, anemia, asthma, Lynch syndrome that presents the emergency department today for abdominal pain nausea vomiting diarrhea that started today.  Most likely has viral gastroenteritis, will give IV fluids Zofran and pain medication at this time.  Also obtain CT imaging due to guarding all over her abdomen with leukocytosis of 17.Low concerns for C. difficile, patient has had 2 days of vomiting diarrhea with sick contact at home with no recent antibiotics.   CT does show some signs of enteritis, no concerns for obstruction.  Did discuss incidental findings with patient.  Work-up today shows CBC of 17.4, otherwise unremarkable.  Urinalysis suggestive of patient being on her menstrual cycle.  After medication, upon reevaluation patient states that she feels much better.  Patient states that she is ready to go home.  Patient passed p.o. challenge.  Strict return precautions given, patient will follow up with PCP.  Doubt need for further emergent work up at this time. I explained the diagnosis and have given explicit precautions to return to the ER including for any other new or worsening symptoms. The  patient understands and accepts the medical plan as it's been dictated and I have answered their questions. Discharge instructions concerning home care and prescriptions have been given. The patient is STABLE and is discharged to home in good condition.  Final Clinical Impression(s) / ED Diagnoses Final diagnoses:  Gastroenteritis    Rx / DC Orders ED Discharge Orders         Ordered    ondansetron (ZOFRAN ODT) 4 MG disintegrating tablet  Every 8 hours PRN        04/03/20 2118           Patel, Shalyn, PA-C 04/03/20 2123    Messick, Peter C, MD 04/05/20 1019  

## 2020-04-03 NOTE — ED Notes (Signed)
Patient transported to CT 

## 2020-09-15 ENCOUNTER — Encounter: Payer: Self-pay | Admitting: Internal Medicine

## 2021-11-24 ENCOUNTER — Encounter: Payer: Self-pay | Admitting: Physician Assistant

## 2021-11-24 ENCOUNTER — Ambulatory Visit (INDEPENDENT_AMBULATORY_CARE_PROVIDER_SITE_OTHER): Payer: Managed Care, Other (non HMO)

## 2021-11-24 ENCOUNTER — Ambulatory Visit (INDEPENDENT_AMBULATORY_CARE_PROVIDER_SITE_OTHER): Payer: Managed Care, Other (non HMO) | Admitting: Physician Assistant

## 2021-11-24 DIAGNOSIS — M25532 Pain in left wrist: Secondary | ICD-10-CM

## 2021-11-24 MED ORDER — METHYLPREDNISOLONE 4 MG PO TBPK: ORAL_TABLET | ORAL | 0 refills | Status: DC

## 2021-11-24 NOTE — Progress Notes (Signed)
Office Visit Note   Patient: Melissa Cantu           Date of Birth: 26-Dec-1990           MRN: [id number] Visit Date: 11/24/2021              Requested by: Doloris Hall, [place],  [place] [address] PCP: Doloris Hall, FNP  Chief Complaint  Patient presents with   Left Wrist - Pain      HPI: Melissa Cantu is a pleasant 31 year old right-hand-dominant woman who works as a Theme park manager.  She has had some pain in her left wrist on and off but had acute increase in her left wrist pain on the dorsum of her wrist in the last day or so.  She denies any particular injury though does admit that she had lifted her son to go trigger treating and interlocked her hands.  She did not think this could be the cause but it is the only thing she remembers.  She denies any fever or chills.  She said sometimes she gets a little numbing but mostly gets pain directed over the wrist dorsally.  She thinks she has had a cyst there in the past but cannot appreciate that today.  Assessment & Plan: Visit Diagnoses:  1. Pain in left wrist     Plan: Left dorsal wrist pain.  No acute injury no signs of infection.  She is focally tender over the radial carpal joint.  I do not appreciate any cystic mass today.  I recommended immobilization in a wrist splint and a steroid taper.  She is taken these in the past and not had difficulty.  She is to take these with food.  She was told not to take any ibuprofen.  Would like her reevaluated with Dr. Durward Fortes who has seen her in the past for shoulder issues.  Follow-Up Instructions: No follow-ups on file.   Ortho Exam  Patient is alert, oriented, no adenopathy, well-dressed, normal affect, normal respiratory effort. Left wrist she has no redness no erythema no cellulitis.  She is able to oppose all of her fingers without difficulty.  She has a strong radial pulse.  She has brisk capillary refill.  She has no tenderness over the dorsal compartment.   No tenderness with supination and pronation of her wrist she does have pain over the dorsal wrist with extension of the wrist.  Imaging: XR Wrist Complete Left  Result Date: 11/24/2021 Radiographs of her left wrist were reviewed today.  She has well-maintained alignment to the carpal metacarpal joint.  She has a small ossification that is not acute off the ulna.  No acute fractures noted no degenerative changes  No images are attached to the encounter.  Labs: Lab Results  Component Value Date   HGBA1C 5.4 03/03/2018   LABORGA Multiple organisms present,each less than 10/03/2015   LABORGA 10,000 CFU/mL. 10/03/2015   LABORGA These organisms,commonly found on external 10/03/2015   LABORGA and internal genitalia,are considered colonizers. 10/03/2015   LABORGA No further testing performed. 10/03/2015     Lab Results  Component Value Date   ALBUMIN 4.6 04/03/2020   ALBUMIN 4.3 05/16/2019   ALBUMIN 4.2 07/04/2018    Lab Results  Component Value Date   MG 2.0 03/03/2018   Lab Results  Component Value Date   VD25OH 25 (L) 03/03/2018    No results found for: "PREALBUMIN"    Latest Ref Rng & Units 04/03/2020  4:10 PM 07/04/2018   12:53 PM 03/03/2018   11:08 AM  CBC EXTENDED  WBC 4.0 - 10.5 K/uL 17.4  9.4  7.1   RBC 3.87 - 5.11 MIL/uL 4.74  4.23  4.33   Hemoglobin 12.0 - 15.0 g/dL 14.0  12.6  12.9   HCT 36.0 - 46.0 % 42.5  39.1  38.4   Platelets 150 - 400 K/uL 292  234.0  224   NEUT# 1.4 - 7.7 K/uL  4.2  3,557   Lymph# 0.7 - 4.0 K/uL  4.0  2,655      There is no height or weight on file to calculate BMI.  Orders:  Orders Placed This Encounter  Procedures   XR Wrist Complete Left   Meds ordered this encounter  Medications   methylPREDNISolone (MEDROL DOSEPAK) 4 MG TBPK tablet    Sig: Take as directed with food    Dispense:  21 tablet    Refill:  0     Procedures: No procedures performed  Clinical Data: No additional findings.  ROS:  All other systems  negative, except as noted in the HPI. Review of Systems  Objective: Vital Signs: There were no vitals taken for this visit.  Specialty Comments:  No specialty comments available.  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    ADHD (attention deficit hyperactivity disorder)    Anemia    Asthma    Chronic headaches    Hemorrhoids    MSH2-related Lynch syndrome (HNPCC1)    Pneumonia    UTI (urinary tract infection)     Family History  Problem Relation Age of Onset   Cancer Sister 23       cervical cancer   Other Mother    Colon polyps Mother    Diabetes Mother    Hypertension Mother    Cancer Father        Waldenstom's   Breast cancer Maternal Grandmother 8   Colon cancer Maternal Grandfather 64   Skin cancer Maternal Grandfather    Prostate cancer Maternal Grandfather    Kidney cancer Maternal Grandfather    Breast cancer Paternal Grandmother    Parkinson's disease Paternal Grandfather    Colon cancer Maternal Uncle    Breast cancer Maternal Aunt    Esophageal cancer Neg Hx    Stomach cancer Neg Hx    Rectal cancer Neg Hx     Past Surgical History:  Procedure Laterality Date   adnoidectomy     HERNIA REPAIR     umbillical repair as infant   TONSILLECTOMY     Social History   Occupational History   Not on file  Tobacco Use   Smoking status: Every Day    Packs/day: 1.00    Types:  Cigarettes   Smokeless tobacco: Never   Tobacco comments:    cutting down  Vaping Use   Vaping Use: Some  days  Substance and Sexual Activity   Alcohol use: Yes    Comment: <3 drinks per month   Drug use: Not Currently    Comment: former marijuana   Sexual activity: Yes    Partners: Male    Comment: fiance had vasectomy

## 2021-11-26 HISTORY — PX: ABDOMINAL HYSTERECTOMY: SHX81

## 2022-05-30 IMAGING — CT CT ABD-PELV W/ CM
2 of 4 series · 16 of 46 positions shown, 18 images · IV contrast (APPLIED)
Comparison: None.

CLINICAL DATA: Abdominal pain with cramping.  Vomiting.

EXAM:
CT ABDOMEN AND PELVIS WITH CONTRAST
TECHNIQUE: Multidetector CT imaging of the abdomen and pelvis was performed
using the standard protocol following bolus administration of
intravenous contrast.
CONTRAST:  100mL OMNIPAQUE IOHEXOL 300 MG/ML  SOLN

[Series 3: abdomen 5.0 · axial · 0.85mm/px · z∈[-685,-310]mm · 13 of 85 slices shown, 15 images]
[im 5/85  soft-tissue]
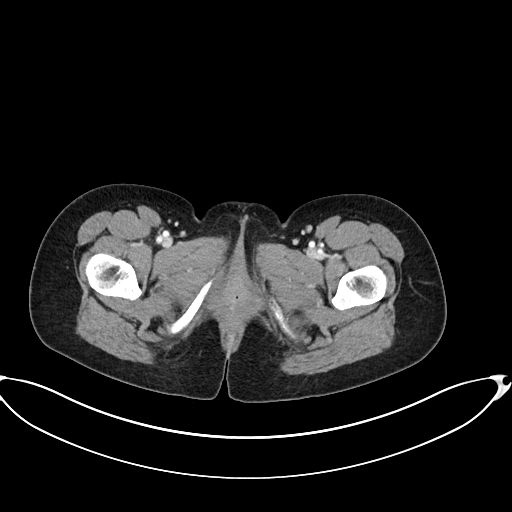
[im 5/85  bone]
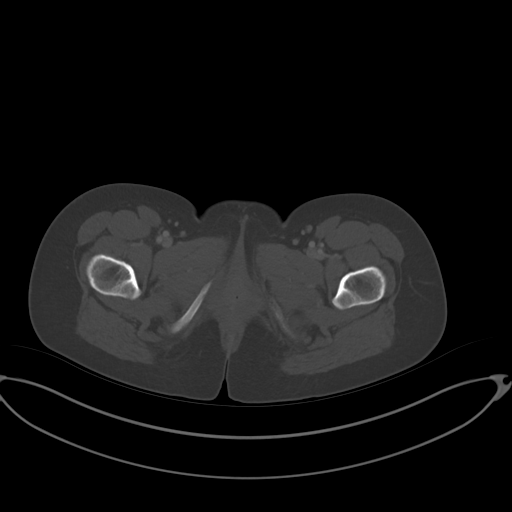
[im 10/85  soft-tissue]
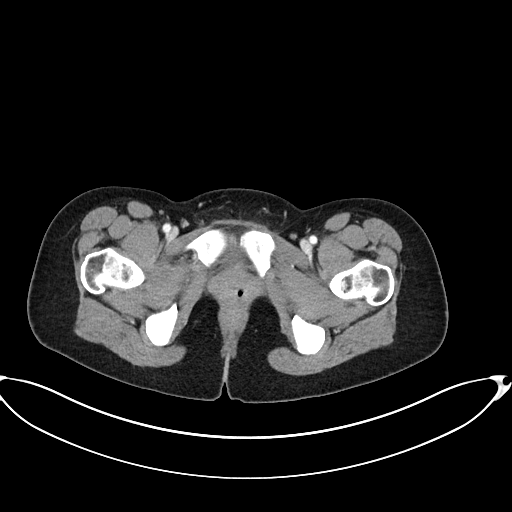
[im 20/85  soft-tissue]
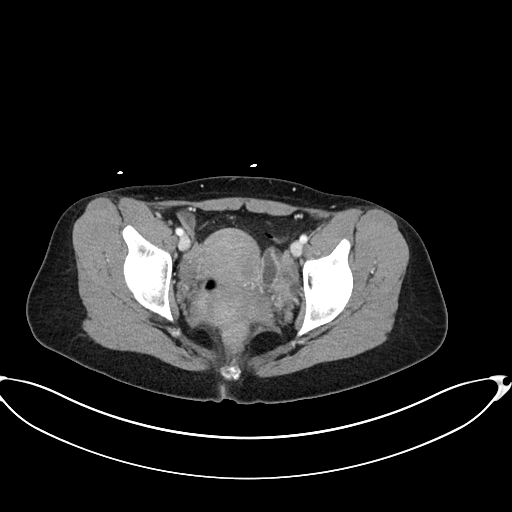
[im 25/85  soft-tissue]
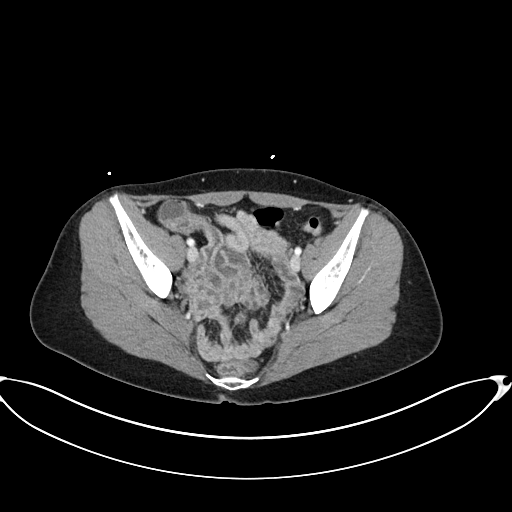
[im 30/85  soft-tissue]
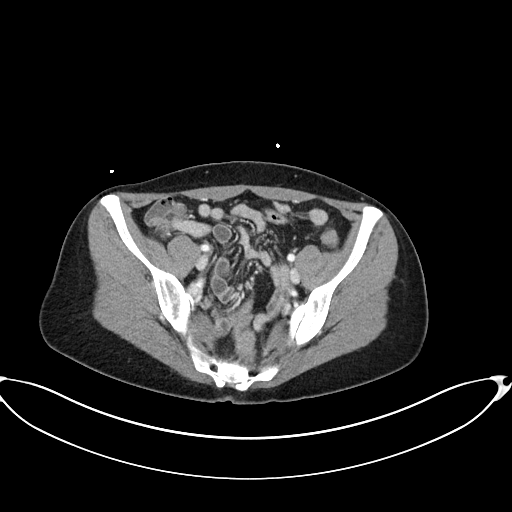
[im 35/85  soft-tissue]
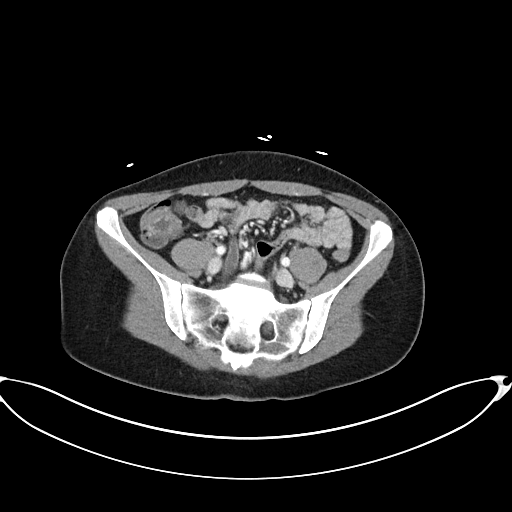
[im 45/85  soft-tissue]
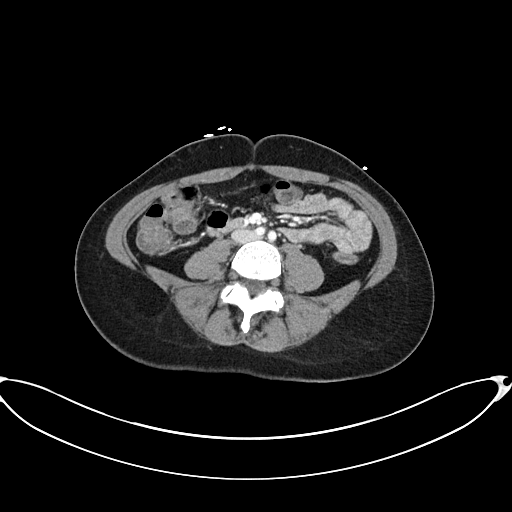
[im 50/85  soft-tissue]
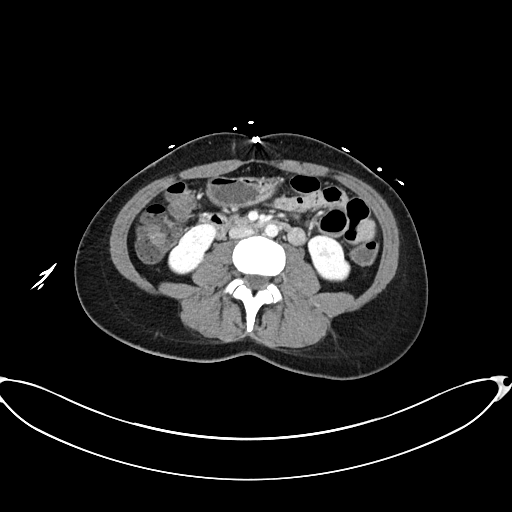
[im 55/85  soft-tissue]
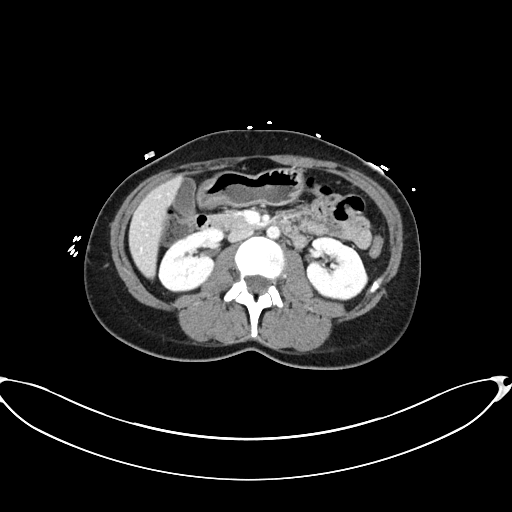
[im 55/85  bone]
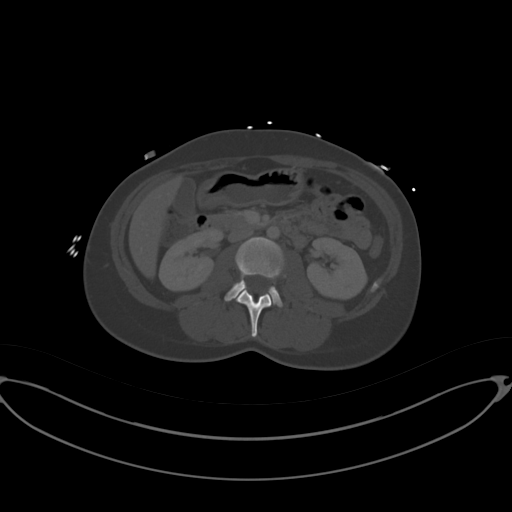
[im 60/85  soft-tissue]
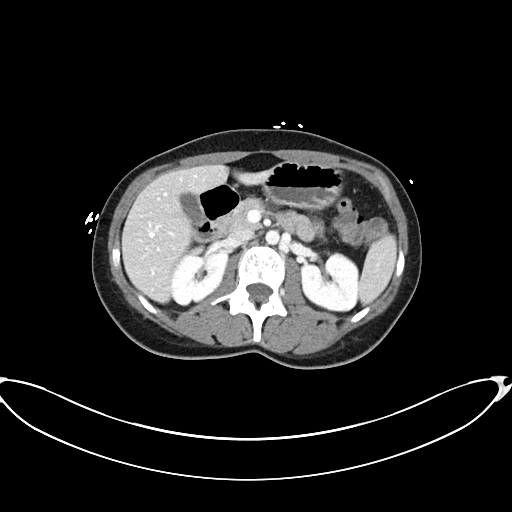
[im 65/85  soft-tissue]
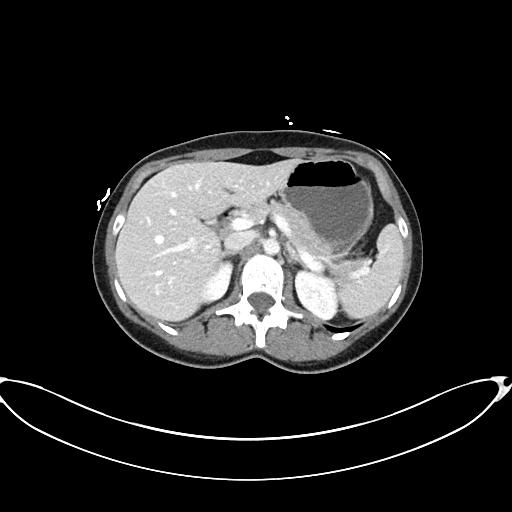
[im 75/85  soft-tissue]
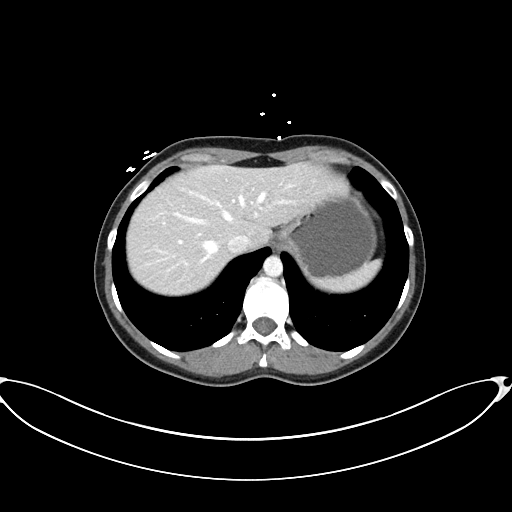
[im 80/85  soft-tissue]
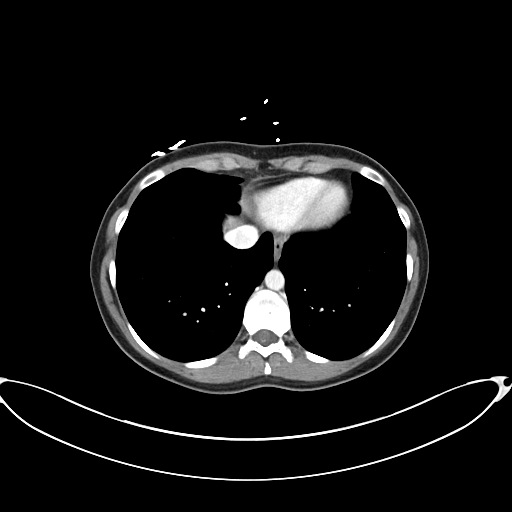

[Series 6: abdomen 3.0 mpr cor · coronal · 0.71mm/px · 3 of 88 slices shown]
[im 30/88  soft-tissue]
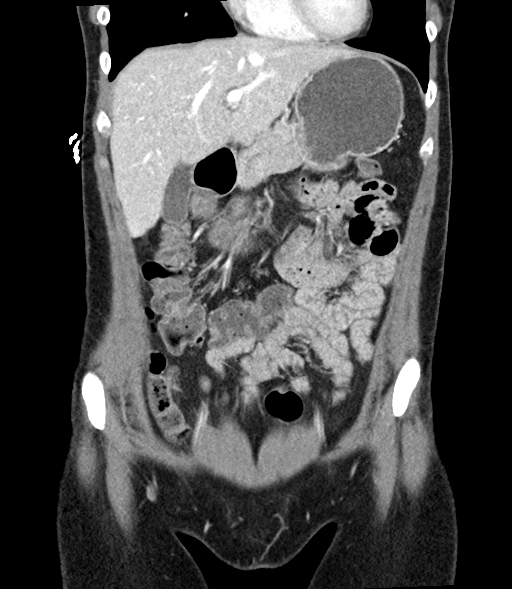
[im 39/88  soft-tissue]
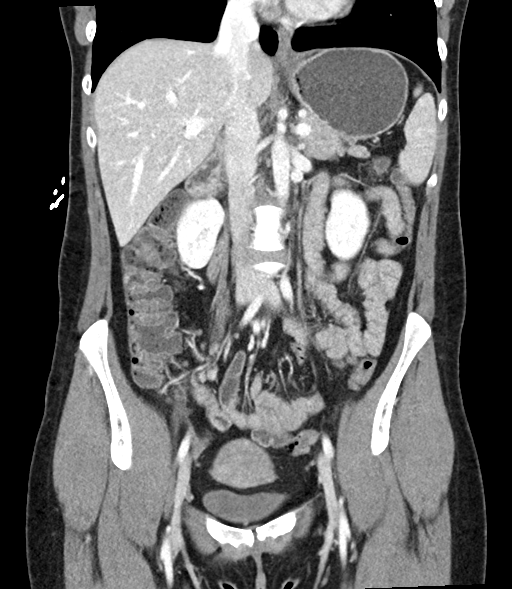
[im 49/88  soft-tissue]
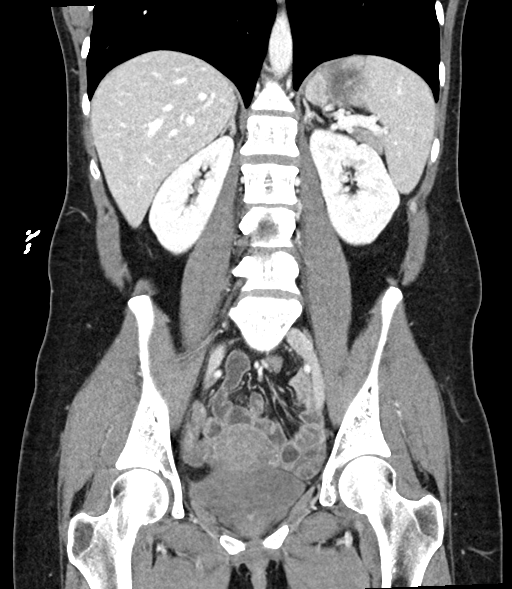

[16 of 46 positions shown; findings below may reference images not displayed]

FINDINGS: Lower chest: The lung bases are clear.

Hepatobiliary: No focal liver abnormality is seen. No gallstones,
gallbladder wall thickening, or biliary dilatation.

Pancreas: Unremarkable. No pancreatic ductal dilatation or
surrounding inflammatory changes.

Spleen: Normal in size without focal abnormality.

Adrenals/Urinary Tract: Normal adrenal glands. No hydronephrosis or
perinephric edema. Homogeneous renal enhancement. Tiny cortical
hypodensity in the upper right kidney is too small to accurately
characterize. Urinary bladder is partially distended without wall
thickening.

Stomach/Bowel: Bowel evaluation is limited in the absence of enteric
contrast. Fluid/ingested material distends the stomach. No gastric
wall thickening. No bowel obstruction. Pelvic bowel loops are
fluid-filled but nondilated, equivocal wall hyperemia. No mesenteric
edema or fat stranding. No terminal ileal inflammation. Appendix is
not confidently visualized, no evidence of appendicitis. Small
volume of stool in the right colon. Descending and sigmoid colon are
decompressed. There is minimal wall thickening and mucosal hyperemia
the rectosigmoid colon.

Vascular/Lymphatic: Normal caliber abdominal aorta. Patent portal
vein. No abdominopelvic adenopathy.

Reproductive: Unremarkable uterus. Tampon in the vagina. Ovaries are
symmetric in size. No adnexal mass.

Other: No free air, free fluid, or intra-abdominal fluid collection.

Musculoskeletal: There are no acute or suspicious osseous
abnormalities. Hemi transitional lumbosacral anatomy with enlarged
left transverse process and pseudoarticulation with the sacrum.
IMPRESSION: 1. Minimal wall thickening and mucosal hyperemia of the rectosigmoid
colon, suggesting mild colitis.
2. Fluid-filled but nondilated pelvic bowel loops with equivocal
wall hyperemia, may be reactive or can be seen with enteritis. No
obstruction or perforation.
3. Hemi transitional lumbosacral anatomy with enlarged left
transverse process and pseudoarticulation with the sacrum, which can
be seen with Bertolotti syndrome in the appropriate clinical
setting. Recommend correlation for any history of back pain.

## 2022-07-07 ENCOUNTER — Encounter: Payer: Self-pay | Admitting: Internal Medicine

## 2023-04-20 NOTE — Progress Notes (Unsigned)
 New patient visit   Patient: Melissa Cantu   DOB: [birth year]   32 y.o. Female  MRN: [id number] Visit Date: 04/21/2023  Today's healthcare provider: Alfredia Ferguson, PA-C   No chief complaint on file.  Subjective    Melissa Cantu is a 33 y.o. female who presents today as a new patient to establish care.   ***  Past Medical History:  Diagnosis Date   Abnormal Pap smear 2011 HR HPV, 07/23/2011 CIN 1, +HPV/ LGSIL, 10/20/2011 LGSIL, + HPV / CIN 1.    ADHD (attention deficit hyperactivity disorder)    Anemia    Asthma    Chronic headaches    Hemorrhoids    MSH2-related Lynch syndrome (HNPCC1)    Pneumonia    UTI (urinary tract infection)    Past Surgical History:  Procedure Laterality Date   adnoidectomy     HERNIA REPAIR     umbillical repair as infant   TONSILLECTOMY     Family Status  Relation Name Status   Sister 1/2 Alive   Mother  Alive       Lynch Syndrome   Father  Alive   Brother  Alive   MGM  Alive   MGF  Deceased   PGM  Deceased   PGF  Alive   Mat Uncle  Alive   Mat Aunt  Alive   Neg Hx  (Not Specified)  No partnership data on file   Family History  Problem Relation Age of Onset   Cancer Sister 62       cervical cancer   Other Mother    Colon polyps Mother    Diabetes Mother    Hypertension Mother    Cancer Father        Waldenstom's   Breast cancer Maternal Grandmother 64   Colon cancer Maternal Grandfather 64   Skin cancer Maternal Grandfather    Prostate cancer Maternal Grandfather    Kidney cancer Maternal Grandfather    Breast cancer Paternal Grandmother    Parkinson's disease Paternal Grandfather    Colon cancer Maternal Uncle    Breast cancer Maternal Aunt    Esophageal cancer Neg Hx    Stomach cancer Neg Hx    Rectal cancer Neg Hx    Social History   Socioeconomic History   Marital status: Married    Spouse name: Not on file   Number of children: Not on file   Years of education: Not on file   Highest education level:  Associate degree: occupational, Scientist, product/process development, or vocational program  Occupational History   Not on file  Tobacco Use   Smoking status: Every Day    Current packs/day: 1.00    Types: Cigarettes   Smokeless tobacco: Never   Tobacco comments:    cutting down  Vaping Use   Vaping status: Some Days  Substance and Sexual Activity   Alcohol use: Yes    Comment: <3 drinks per month   Drug use: Not Currently    Comment: former marijuana   Sexual activity: Yes    Partners: Male    Comment: fiance had vasectomy   Other Topics Concern   Not on file  Social History Narrative   Not on file   Social Drivers of Health   Financial Resource Strain: Patient Declined (04/15/2023)   Overall Financial Resource Strain (CARDIA)    Difficulty of Paying Living Expenses: Patient declined  Food Insecurity: Patient Declined (04/15/2023)   Hunger Vital Sign    Worried  About Running Out of Food in the Last Year: Patient declined    Ran Out of Food in the Last Year: Patient declined  Transportation Needs: No Transportation Needs (04/15/2023)   PRAPARE - Administrator, Civil Service (Medical): No    Lack of Transportation (Non-Medical): No  Physical Activity: Unknown (04/15/2023)   Exercise Vital Sign    Days of Exercise per Week: Patient declined    Minutes of Exercise per Session: Not on file  Stress: Stress Concern Present (04/15/2023)   [employer] of Occupational Health - Occupational Stress Questionnaire    Feeling of Stress : To some extent  Social Connections: Unknown (04/15/2023)   Social Connection and Isolation Panel [NHANES]    Frequency of Communication with Friends and Family: More than three times a week    Frequency of Social Gatherings with Friends and Family: More than three times a week    Attends Religious Services: Patient declined    Database administrator or Organizations: No    Attends Engineer, structural: Not on file    Marital Status: Married    Outpatient Medications Prior to Visit  Medication Sig   ALPRAZolam (XANAX) 0.5 MG tablet 1/2-1 tablet as needed daily for anxiety   methylPREDNISolone (MEDROL DOSEPAK) 4 MG TBPK tablet Take as directed with food   ondansetron (ZOFRAN ODT) 4 MG disintegrating tablet Take 1 tablet (4 mg total) by mouth every 8 (eight) hours as needed for nausea or vomiting.   Facility-Administered Medications Prior to Visit  Medication Dose Route Frequency Provider   0.9 %  sodium chloride infusion  500 mL Intravenous Once Pyrtle, Carie Caddy, MD   Allergies  Allergen Reactions   Banana Itching   Citrullus Vulgaris Itching    All melons     Immunization History  Administered Date(s) Administered   Influenza Inj Mdck Quad Pf 11/02/2018   Influenza Split 01/03/2013   Pneumococcal Polysaccharide-23 03/03/2018    Health Maintenance  Topic Date Due   Hepatitis C Screening  Never done   DTaP/Tdap/Td (1 - Tdap) Never done   Pneumococcal Vaccine 54-39 Years old (2 of 2 - PCV) 03/04/2019   Colonoscopy  06/21/2020   INFLUENZA VACCINE  08/27/2022   COVID-19 Vaccine (1 - 2024-25 season) Never done   Cervical Cancer Screening (HPV/Pap Cotest)  09/25/2024   HIV Screening  Completed   HPV VACCINES  Aged Out    Patient Care Team: Riley Churches, FNP as PCP - General (Family Medicine)  Review of Systems  {Insert previous labs (optional):23779} {See past labs  Heme  Chem  Endocrine  Serology  Results Review (optional):1}   Objective    There were no vitals taken for this visit. {Insert last BP/Wt (optional):23777}{See vitals history (optional):1}   Physical Exam ***  Depression Screen    03/03/2018   10:21 AM  PHQ 2/9 Scores  PHQ - 2 Score 1   No results found for any visits on 04/21/23.  Assessment & Plan     There are no diagnoses linked to this encounter.   No follow-ups on file.      Alfredia Ferguson, PA-C  [facility] [phone]  (phone) [phone] (fax)  [facility]

## 2023-04-21 ENCOUNTER — Encounter: Payer: Self-pay | Admitting: Physician Assistant

## 2023-04-21 ENCOUNTER — Ambulatory Visit (INDEPENDENT_AMBULATORY_CARE_PROVIDER_SITE_OTHER): Admitting: Physician Assistant

## 2023-04-21 VITALS — BP 110/70 | HR 70 | Temp 97.8°F | Resp 16 | Ht 64.0 in | Wt 128.0 lb

## 2023-04-21 DIAGNOSIS — Z8659 Personal history of other mental and behavioral disorders: Secondary | ICD-10-CM | POA: Diagnosis not present

## 2023-04-21 DIAGNOSIS — F988 Other specified behavioral and emotional disorders with onset usually occurring in childhood and adolescence: Secondary | ICD-10-CM

## 2023-04-21 DIAGNOSIS — Z1509 Genetic susceptibility to other malignant neoplasm: Secondary | ICD-10-CM | POA: Diagnosis not present

## 2023-04-21 DIAGNOSIS — Z1211 Encounter for screening for malignant neoplasm of colon: Secondary | ICD-10-CM

## 2023-04-21 DIAGNOSIS — Z720 Tobacco use: Secondary | ICD-10-CM | POA: Insufficient documentation

## 2023-04-21 DIAGNOSIS — Z7289 Other problems related to lifestyle: Secondary | ICD-10-CM

## 2023-04-21 LAB — COMPREHENSIVE METABOLIC PANEL
ALT: 9 U/L (ref 0–35)
AST: 10 U/L (ref 0–37)
Albumin: 4.7 g/dL (ref 3.5–5.2)
Alkaline Phosphatase: 41 U/L (ref 39–117)
BUN: 14 mg/dL (ref 6–23)
CO2: 26 meq/L (ref 19–32)
Calcium: 9.6 mg/dL (ref 8.4–10.5)
Chloride: 105 meq/L (ref 96–112)
Creatinine, Ser: 0.62 mg/dL (ref 0.40–1.20)
GFR: 119.39 mL/min (ref >60.00)
Glucose, Bld: 81 mg/dL (ref 70–99)
Potassium: 4 meq/L (ref 3.5–5.1)
Sodium: 138 meq/L (ref 135–145)
Total Bilirubin: 0.6 mg/dL (ref 0.2–1.2)
Total Protein: 6.9 g/dL (ref 6.0–8.3)

## 2023-04-21 LAB — CBC WITH DIFFERENTIAL/PLATELET
Basophils Absolute: 0.1 10*3/uL (ref 0.0–0.1)
Basophils Relative: 1 % (ref 0.0–3.0)
Eosinophils Absolute: 0.3 10*3/uL (ref 0.0–0.7)
Eosinophils Relative: 3.4 % (ref 0.0–5.0)
HCT: 39.4 % (ref 36.0–46.0)
Hemoglobin: 12.5 g/dL (ref 12.0–15.0)
Lymphocytes Relative: 34.1 % (ref 12.0–46.0)
Lymphs Abs: 2.5 10*3/uL (ref 0.7–4.0)
MCHC: 31.9 g/dL (ref 30.0–36.0)
MCV: 92.2 fl (ref 78.0–100.0)
Monocytes Absolute: 0.5 10*3/uL (ref 0.1–1.0)
Monocytes Relative: 6.9 % (ref 3.0–12.0)
Neutro Abs: 4.1 10*3/uL (ref 1.4–7.7)
Neutrophils Relative %: 54.6 % (ref 43.0–77.0)
Platelets: 278 10*3/uL (ref 150.0–400.0)
RBC: 4.27 Mil/uL (ref 3.87–5.11)
RDW: 13.3 % (ref 11.5–15.5)
WBC: 7.4 10*3/uL (ref 4.0–10.5)

## 2023-04-21 NOTE — Assessment & Plan Note (Signed)
Referring to psych.

## 2023-04-21 NOTE — Assessment & Plan Note (Signed)
 Vaping poses health risks, and cessation is advised. She acknowledges these risks.

## 2023-04-21 NOTE — Assessment & Plan Note (Signed)
Quit 4-5 years ago.

## 2023-04-21 NOTE — Assessment & Plan Note (Signed)
 Unmedicated. Ref to psych

## 2023-04-21 NOTE — Assessment & Plan Note (Signed)
 Pt gets q 1-2 years colonoscopies, overdue. Referring back. S/p hysterectomy w/ plans for oophorectomy at 42. Pt follows with gyn.

## 2023-04-26 ENCOUNTER — Encounter: Payer: Self-pay | Admitting: Physician Assistant

## 2023-06-21 NOTE — Progress Notes (Deleted)
 Melissa Canard, PA-C [address] [place], [place]  [address] Phone: [phone]   Gastroenterology Consultation  Referring Provider:     Trenton Frock, PA-C Primary Care Physician:  Trenton Frock, PA-C Primary Gastroenterologist:  Melissa Canard, PA-C / Dr. Laurell Pond  Reason for Consultation:     F/U Lynch Syndrome and Hx H. Pylori        HPI:   Melissa Cantu is a 33 y.o. y/o female referred for consultation & management  by Trenton Frock, PA-C.  Established patient of Dr. Bridgett Camps.  She has history of Lynch syndrome (positive MSH2 gene test).  Her mother also has Lynch syndrome.  Patient history of H. pylori treated with Prevpac (amoxicillin  / clarithromycin / lansoprazole) 06/2018.  PMH significant for ADHD, history of mood disorder, and tobacco use.  History of LGSIL of cervix, s/p hysterectomy, ovaries intact.  Current symptoms:  06/22/2019 last colonoscopy by Dr. Bridgett Camps: 3 mm colon mucosal polyp removed from sigmoid colon.  Good prep.  Repeat in 1 year.  06/22/2019 last EGD: Severe erosive gastritis.  Normal esophagus and duodenum.  Biopsy negative for H. pylori.  05/2018 colonoscopy: 7 mm sessile serrated polyp removed from sigmoid colon.  No dysplasia.  Excellent prep. 1 year repeat.  05/2018 EGD: Normal esophagus, stomach, and duodenum.  Biopsies positive for H. pylori treated with Prevpac.  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    ADHD (attention deficit hyperactivity disorder)    Anemia    Asthma    Chronic headaches    Hemorrhoids    MSH2-related Lynch syndrome (HNPCC1)    Pneumonia    UTI (urinary tract infection)     Past Surgical History:  Procedure Laterality Date   ABDOMINAL HYSTERECTOMY  11/2021   ovaries remain   adnoidectomy     HERNIA REPAIR     umbillical repair as infant   TONSILLECTOMY      Prior to Admission medications   Medication Sig  Start Date End Date Taking? Authorizing Provider  Rhubarb (ESTROVEN COMPLETE PO) Take by mouth.    [provider]    Family History  Problem Relation Age of Onset   Cancer Sister 31       cervical cancer   Other Mother    Colon polyps Mother    Diabetes Mother    Hypertension Mother    Cancer Father        Waldenstom's   Breast cancer Maternal Grandmother 70   Colon cancer Maternal Grandfather 71   Skin cancer Maternal Grandfather    Prostate cancer Maternal Grandfather    Kidney cancer Maternal Grandfather    Breast cancer Paternal Grandmother    Parkinson's disease Paternal Grandfather    Colon cancer Maternal Uncle    Breast cancer Maternal Aunt    Esophageal cancer Neg Hx    Stomach cancer Neg Hx    Rectal cancer Neg Hx      Social History   Tobacco Use   Smoking status: Former    Current packs/day: 0.00    Average packs/day: 1 pack/day for 13.0 years (13.0 ttl pk-yrs)    Types: Cigarettes    Start date: 2008    Quit date: 2021    Years since quitting: 4.4   Smokeless tobacco: Never  Vaping Use   Vaping status: Every Day  Substance Use Topics  Alcohol use: Yes    Comment: <3 drinks per month   Drug use: Not Currently    Comment: former marijuana    Allergies as of 06/22/2023 - Review Complete 04/21/2023  Allergen Reaction Noted   Banana Itching 01/16/2015   Citrullus vulgaris Itching 01/16/2015    Review of Systems:    All systems reviewed and negative except where noted in HPI.   Physical Exam:  LMP 09/12/2018  Patient's last menstrual period was 09/12/2018.  General:   Alert,  Well-developed, well-nourished, pleasant and cooperative in NAD Lungs:  Respirations even and unlabored.  Clear throughout to auscultation.   No wheezes, crackles, or rhonchi. No acute distress. Heart:  Regular rate and rhythm; no murmurs, clicks, rubs, or gallops. Abdomen:  Normal bowel sounds.  No bruits.  Soft, and non-distended without masses,  hepatosplenomegaly or hernias noted.  No Tenderness.  No guarding or rebound tenderness.    Neurologic:  Alert and oriented x3;  grossly normal neurologically. Psych:  Alert and cooperative. Normal mood and affect.  Imaging Studies: No results found.  Labs: CBC    Component Value Date/Time   WBC 7.4 04/21/2023 1019   RBC 4.27 04/21/2023 1019   HGB 12.5 04/21/2023 1019   HCT 39.4 04/21/2023 1019   PLT 278.0 04/21/2023 1019   MCV 92.2 04/21/2023 1019   MCH 29.5 04/03/2020 1610   MCHC 31.9 04/21/2023 1019   RDW 13.3 04/21/2023 1019   LYMPHSABS 2.5 04/21/2023 1019   MONOABS 0.5 04/21/2023 1019   EOSABS 0.3 04/21/2023 1019   BASOSABS 0.1 04/21/2023 1019    CMP     Component Value Date/Time   NA 138 04/21/2023 1019   K 4.0 04/21/2023 1019   CL 105 04/21/2023 1019   CO2 26 04/21/2023 1019   GLUCOSE 81 04/21/2023 1019   BUN 14 04/21/2023 1019   CREATININE 0.62 04/21/2023 1019   CREATININE 0.64 03/03/2018 1108   CALCIUM 9.6 04/21/2023 1019   PROT 6.9 04/21/2023 1019   ALBUMIN 4.7 04/21/2023 1019   AST 10 04/21/2023 1019   ALT 9 04/21/2023 1019   ALKPHOS 41 04/21/2023 1019   BILITOT 0.6 04/21/2023 1019   GFRNONAA >60 04/03/2020 1610   GFRNONAA 124 03/03/2018 1108   GFRAA 144 03/03/2018 1108    Assessment and Plan:   [name] is a 33 y.o. y/o female has been referred for   1.  Lynch syndrome - Scheduling EGD & Colonoscopy I discussed risks of EGD and colonoscopy with patient to include risk of bleeding, perforation, and risk of sedation.  Patient expressed understanding and agrees to proceed with procedures.    2.  History of sessile serrated polyp  3.  History of H. pylori treated 06/2018 with Prevpac.  EGD with gastric biopsy 05/2019 showed negative H. pylori, confirming eradication.  4.  History of erosive gastritis - Avoid NSAIDs  Follow up ***  Melissa Canard, PA-C

## 2023-06-22 ENCOUNTER — Ambulatory Visit: Admitting: Physician Assistant

## 2023-10-13 ENCOUNTER — Ambulatory Visit: Payer: Self-pay

## 2023-10-13 NOTE — Telephone Encounter (Signed)
 FYI Only or Action Required?: FYI only for provider.  Patient was last seen in primary care on 04/21/2023 by Cyndi Shaver, PA-C.  Called Nurse Triage reporting Facial Pain.  Symptoms began x 2 days ago.  Interventions attempted: OTC medications: Sudafed, Tylenol.  Symptoms are: gradually worsening.  Triage Disposition: See HCP Within 4 Hours (Or PCP Triage)  Patient/caregiver understands and will follow disposition?: Yes  **Referred to UC; as there are no same day appt. Available**          Copied from CRM #[id number]. Topic: Clinical - Red Word Triage >> Oct 13, 2023  9:11 AM Rosina BIRCH wrote: Reason for RMF:ejupzwu called stating she went to the dentist two days ago and her face is still sore and she is having extreme sinus pressure Reason for Disposition  [1] SEVERE sinus pain (e.g., excruciating) AND [2] not improved 2 hours after pain medicine  Answer Assessment - Initial Assessment Questions 1. LOCATION: Where does it hurt?      Right side of face by cheek bone, all the way up to eyes  2. ONSET: When did the sinus pain start?  (e.g., hours, days)       X 2 days ago   3. SEVERITY: How bad is the pain?   (Scale 0-10; or none, mild, moderate or severe)     9/10  4. RECURRENT SYMPTOM: Have you ever had sinus problems before? If Yes, ask: When was the last time? and What happened that time?       Yes, not this painful  5. NASAL CONGESTION: Is the nose blocked? If Yes, ask: Can you open it or must you breathe through your mouth?     Yes  6. NASAL DISCHARGE: Do you have discharge from your nose? If so ask, What color?     Yes, clear, yellowish tint  7. FEVER: Do you have a fever? If Yes, ask: What is it, how was it measured, and when did it start?      No   8. OTHER SYMPTOMS: Do you have any other symptoms? (e.g., sore throat, cough, earache, difficulty breathing)  Cough, right ear ache   For home care patient took Sudafed and  Tylenol. Patient has dentist appt. Later today but doesn't wish to go, as she isn't feeling well. Appt. Offered; no same day appts. Were available, patient referred to UC; she agrees with plan of care as she wishes to be seen today.  Protocols used: Sinus Pain or Congestion-A-AH

## 2023-10-13 NOTE — Telephone Encounter (Signed)
 Pt going to [place]

## 2023-12-13 ENCOUNTER — Other Ambulatory Visit: Payer: Self-pay

## 2023-12-13 ENCOUNTER — Encounter (HOSPITAL_BASED_OUTPATIENT_CLINIC_OR_DEPARTMENT_OTHER): Payer: Self-pay | Admitting: Orthopedic Surgery

## 2023-12-13 NOTE — H&P (Signed)
 PREOPERATIVE H&P  Chief Complaint: left wrist pain  HPI: Melissa Cantu is a 33 y.o. female female who presents for evaluation of left wrist pain. She reports progressive enlargement of a cyst on her dorsal wrist over the past few years, which has resulted in significant pain and functional limitations. 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 She has tried various methods for symptom relief, including heating a rice sock nightly and massaging the area, which provides temporary relief. Oral medications such as tylenol and previously prescribed ibuprofen have not alleviated her symptoms. She denies any prior injuries to the wrist. She is right-handed. Discussed treatment options including observation, aspiration, and surgical removal. Patient has elected for surgical treatment.   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    ADHD (attention deficit hyperactivity disorder)    Anemia    Asthma    Chronic headaches    Hemorrhoids    MSH2-related Lynch syndrome (HNPCC1)    Pneumonia    UTI (urinary tract infection)    Past Surgical History:  Procedure Laterality Date   ABDOMINAL HYSTERECTOMY  11/2021   ovaries remain   adnoidectomy     HERNIA REPAIR     umbillical repair as infant   TONSILLECTOMY     Social History   Socioeconomic History   Marital status: Married    Spouse name: Not on file   Number of children: Not on file   Years of education: Not on file   Highest education level: Associate degree: occupational, scientist, product/process development, or vocational program  Occupational History   Not on file  Tobacco Use   Smoking status: Former    Current packs/day: 0.00    Average packs/day: 1 pack/day for 13.0 years (13.0 ttl pk-yrs)    Types: Cigarettes    Start date: 2008    Quit  date: 2021    Years since quitting: 4.8   Smokeless tobacco: Never  Vaping Use   Vaping status: Every Day  Substance and Sexual Activity   Alcohol use: Yes    Comment: <3 drinks per month   Drug use: Not Currently    Comment: former marijuana   Sexual activity: Yes    Partners: Male    Comment: fiance had vasectomy   Other Topics Concern   Not on file  Social History Narrative   Not on file   Social Drivers of Health   Financial Resource Strain: Patient Declined (04/15/2023)   Overall Financial Resource Strain (CARDIA)    Difficulty of Paying Living Expenses: Patient declined  Food Insecurity: Patient Declined (04/15/2023)   Hunger Vital Sign    Worried About Running Out of Food in the Last Year: Patient declined    Ran Out of Food in the Last Year: Patient declined  Transportation Needs: No Transportation Needs (04/15/2023)   PRAPARE - Administrator, Civil Service (Medical): No    Lack of Transportation (Non-Medical): No  Physical Activity: Unknown (04/15/2023)   Exercise Vital Sign    Days of Exercise per Week: Patient declined    Minutes of Exercise per Session: Not on file  Stress: Stress Concern Present (04/15/2023)   [employer] of Occupational Health - Occupational Stress Questionnaire    Feeling of Stress : To some extent  Social Connections: Unknown (04/15/2023)   Social Connection and Isolation Panel    Frequency of Communication with Friends and Family: More than three times a week    Frequency of Social Gatherings with Friends and Family: More than three times a week    Attends Religious Services: Patient declined    Database Administrator or Organizations: No    Attends Engineer, Structural: Not on file    Marital Status: Married   Family History  Problem Relation Age of Onset   Cancer Sister 24       cervical cancer   Other Mother    Colon polyps Mother    Diabetes Mother    Hypertension Mother    Cancer Father         [place]stom's   Breast cancer Maternal Grandmother 64   Colon cancer Maternal Grandfather 64   Skin cancer Maternal Grandfather    Prostate cancer Maternal Grandfather    Kidney cancer Maternal Grandfather    Breast cancer Paternal Grandmother    Parkinson's disease Paternal Grandfather    Colon cancer Maternal Uncle    Breast cancer Maternal Aunt    Esophageal cancer Neg Hx    Stomach cancer Neg Hx    Rectal cancer Neg Hx    Allergies  Allergen Reactions   Banana Itching   Citrullus Vulgaris Itching    All melons    Prior to Admission medications   Medication Sig Start Date End Date Taking? Authorizing Provider  Rhubarb (ESTROVEN COMPLETE PO) Take by mouth.    [provider]     Positive ROS: All other systems have been reviewed and were otherwise negative with the exception of those mentioned in the HPI and as above.  Physical Exam: General: Alert, no acute distress Cardiovascular: No pedal edema Respiratory: No cyanosis, no use of accessory musculature GI: No organomegaly, abdomen is soft and non-tender Skin: No lesions in the area of chief complaint Neurologic: Sensation intact distally Psychiatric: Patient is competent for consent with normal mood and affect Lymphatic: No axillary or cervical lymphadenopathy  MUSCULOSKELETAL: Dorsal cyst at the joint line most prominent on flexion. Tenderness upon palpation. Full ROM of all fingers of left hand and wrist. Distal sensation intact.   Imaging: 3 views of left wrist show no abnormalities  Assessment: Ganglion cyst, left wrist   Plan: Plan for Procedure(s): EXCISION, GANGLION CYST, WRIST  The risks benefits and alternatives were discussed with the patient including but not limited to the risks of nonoperative treatment, versus surgical intervention including infection, bleeding, nerve injury,  blood clots, cardiopulmonary complications, morbidity, mortality, among others, and they were willing to proceed.     Tenya Araque K Doreather Hoxworth, PA-C    12/13/2023 1:42 PM

## 2023-12-14 ENCOUNTER — Encounter (HOSPITAL_BASED_OUTPATIENT_CLINIC_OR_DEPARTMENT_OTHER): Admission: RE | Disposition: A | Payer: Self-pay | Source: Home / Self Care | Attending: Orthopedic Surgery

## 2023-12-14 ENCOUNTER — Ambulatory Visit (HOSPITAL_BASED_OUTPATIENT_CLINIC_OR_DEPARTMENT_OTHER): Admitting: Anesthesiology

## 2023-12-14 ENCOUNTER — Ambulatory Visit (HOSPITAL_BASED_OUTPATIENT_CLINIC_OR_DEPARTMENT_OTHER)
Admission: RE | Admit: 2023-12-14 | Discharge: 2023-12-14 | Disposition: A | Discharge status: Home / Self Care | Attending: Orthopedic Surgery | Admitting: Orthopedic Surgery

## 2023-12-14 ENCOUNTER — Encounter (HOSPITAL_BASED_OUTPATIENT_CLINIC_OR_DEPARTMENT_OTHER): Payer: Self-pay | Admitting: Orthopedic Surgery

## 2023-12-14 DIAGNOSIS — M67432 Ganglion, left wrist: Secondary | ICD-10-CM | POA: Insufficient documentation

## 2023-12-14 DIAGNOSIS — F17290 Nicotine dependence, other tobacco product, uncomplicated: Secondary | ICD-10-CM | POA: Diagnosis not present

## 2023-12-14 HISTORY — PX: GANGLION CYST EXCISION: SHX1691

## 2023-12-14 SURGERY — EXCISION, GANGLION CYST, WRIST
Anesthesia: General | Site: Wrist | Laterality: Left

## 2023-12-14 MED ORDER — LIDOCAINE 2% (20 MG/ML) 5 ML SYRINGE
INTRAMUSCULAR | Status: AC
  Filled 2023-12-14: qty 5

## 2023-12-14 MED ORDER — LIDOCAINE HCL (CARDIAC) PF 100 MG/5ML IV SOSY
PREFILLED_SYRINGE | INTRAVENOUS | Status: DC | PRN
  Administered 2023-12-14: 60 mg via INTRAVENOUS

## 2023-12-14 MED ORDER — CEFAZOLIN SODIUM-DEXTROSE 2-3 GM-%(50ML) IV SOLR
INTRAVENOUS | Status: DC | PRN
  Administered 2023-12-14: 2 g via INTRAVENOUS

## 2023-12-14 MED ORDER — FENTANYL CITRATE (PF) 100 MCG/2ML IJ SOLN
INTRAMUSCULAR | Status: DC | PRN
  Administered 2023-12-14: 25 ug via INTRAVENOUS
  Administered 2023-12-14: 50 ug via INTRAVENOUS
  Administered 2023-12-14: 25 ug via INTRAVENOUS

## 2023-12-14 MED ORDER — DEXAMETHASONE SOD PHOSPHATE PF 10 MG/ML IJ SOLN
INTRAMUSCULAR | Status: DC | PRN
  Administered 2023-12-14: 5 mg via INTRAVENOUS

## 2023-12-14 MED ORDER — ACETAMINOPHEN 500 MG PO TABS
1000.0000 mg | ORAL_TABLET | Freq: Once | ORAL | Status: AC
  Administered 2023-12-14: 1000 mg via ORAL

## 2023-12-14 MED ORDER — LACTATED RINGERS IV SOLN
INTRAVENOUS | Status: DC | PRN
Start: 2023-12-14 — End: 2023-12-14

## 2023-12-14 MED ORDER — MIDAZOLAM HCL 2 MG/2ML IJ SOLN
INTRAMUSCULAR | Status: AC
  Filled 2023-12-14: qty 2

## 2023-12-14 MED ORDER — PROPOFOL 10 MG/ML IV BOLUS
INTRAVENOUS | Status: DC | PRN
  Administered 2023-12-14: 120 mg via INTRAVENOUS

## 2023-12-14 MED ORDER — ONDANSETRON HCL 4 MG/2ML IJ SOLN
INTRAMUSCULAR | Status: AC
  Filled 2023-12-14: qty 2

## 2023-12-14 MED ORDER — FENTANYL CITRATE (PF) 100 MCG/2ML IJ SOLN
INTRAMUSCULAR | Status: AC
  Filled 2023-12-14: qty 2

## 2023-12-14 MED ORDER — FENTANYL CITRATE (PF) 100 MCG/2ML IJ SOLN
25.0000 ug | INTRAMUSCULAR | Status: DC | PRN
  Administered 2023-12-14 (×2): 50 ug via INTRAVENOUS

## 2023-12-14 MED ORDER — OXYCODONE HCL 5 MG PO TABS
5.0000 mg | ORAL_TABLET | Freq: Once | ORAL | Status: AC | PRN
  Administered 2023-12-14: 5 mg via ORAL

## 2023-12-14 MED ORDER — CEFAZOLIN SODIUM-DEXTROSE 2-4 GM/100ML-% IV SOLN: 2.0000 g | INTRAVENOUS | Status: DC

## 2023-12-14 MED ORDER — HYDROCODONE-ACETAMINOPHEN 5-325 MG PO TABS: 1.0000 | ORAL_TABLET | Freq: Four times a day (QID) | ORAL | 0 refills | Status: AC | PRN

## 2023-12-14 MED ORDER — POVIDONE-IODINE 10 % EX SWAB: 2.0000 | Freq: Once | CUTANEOUS | Status: DC

## 2023-12-14 MED ORDER — OXYCODONE HCL 5 MG/5ML PO SOLN: 5.0000 mg | Freq: Once | ORAL | Status: AC | PRN

## 2023-12-14 MED ORDER — KETOROLAC TROMETHAMINE 30 MG/ML IJ SOLN
INTRAMUSCULAR | Status: DC | PRN
Start: 2023-12-14 — End: 2023-12-14
  Administered 2023-12-14: 15 mg via INTRAVENOUS

## 2023-12-14 MED ORDER — ONDANSETRON HCL 4 MG/2ML IJ SOLN
INTRAMUSCULAR | Status: DC | PRN
  Administered 2023-12-14: 4 mg via INTRAVENOUS

## 2023-12-14 MED ORDER — MIDAZOLAM HCL (PF) 2 MG/2ML IJ SOLN
INTRAMUSCULAR | Status: DC | PRN
Start: 2023-12-14 — End: 2023-12-14
  Administered 2023-12-14: 2 mg via INTRAVENOUS

## 2023-12-14 MED ORDER — OXYCODONE HCL 5 MG PO TABS
ORAL_TABLET | ORAL | Status: AC
  Filled 2023-12-14: qty 1

## 2023-12-14 MED ORDER — DROPERIDOL 2.5 MG/ML IJ SOLN: 0.6250 mg | Freq: Once | INTRAMUSCULAR | Status: DC | PRN

## 2023-12-14 MED ORDER — BUPIVACAINE HCL (PF) 0.5 % IJ SOLN
INTRAMUSCULAR | Status: DC | PRN
  Administered 2023-12-14: 10 mL

## 2023-12-14 MED ORDER — POVIDONE-IODINE 7.5 % EX SOLN
Freq: Once | CUTANEOUS | Status: DC
  Filled 2023-12-14: qty 118

## 2023-12-14 MED ORDER — LACTATED RINGERS IV SOLN: INTRAVENOUS | Status: DC

## 2023-12-14 MED ORDER — ACETAMINOPHEN 500 MG PO TABS
ORAL_TABLET | ORAL | Status: AC
  Filled 2023-12-14: qty 2

## 2023-12-14 MED ORDER — PROPOFOL 10 MG/ML IV BOLUS
INTRAVENOUS | Status: AC
  Filled 2023-12-14: qty 20

## 2023-12-14 SURGICAL SUPPLY — 33 items
BLADE SURG 15 STRL LF DISP TIS (BLADE) ×1 IMPLANT
BNDG COHESIVE 3X5 TAN ST LF (GAUZE/BANDAGES/DRESSINGS) ×1 IMPLANT
BNDG COMPR ESMARK 4X3 LF (GAUZE/BANDAGES/DRESSINGS) IMPLANT
BNDG ELASTIC 3INX 5YD STR LF (GAUZE/BANDAGES/DRESSINGS) ×1 IMPLANT
CORD BIPOLAR FORCEPS 12FT (ELECTRODE) IMPLANT
COVER BACK TABLE 60X90IN (DRAPES) ×1 IMPLANT
CUFF TOURN SGL QUICK 18X4 (TOURNIQUET CUFF) IMPLANT
DRAPE EXTREMITY T 121X128X90 (DISPOSABLE) ×1 IMPLANT
DRAPE IMP U-DRAPE 54X76 (DRAPES) ×1 IMPLANT
DRAPE SURG 17X23 STRL (DRAPES) ×1 IMPLANT
DURAPREP 26ML APPLICATOR (WOUND CARE) ×1 IMPLANT
GAUZE SPONGE 4X4 12PLY STRL (GAUZE/BANDAGES/DRESSINGS) ×1 IMPLANT
GAUZE XEROFORM 1X8 LF (GAUZE/BANDAGES/DRESSINGS) ×1 IMPLANT
GLOVE BIO SURGEON STRL SZ7 (GLOVE) ×1 IMPLANT
GLOVE BIOGEL PI IND STRL 7.0 (GLOVE) ×1 IMPLANT
GLOVE BIOGEL PI IND STRL 8 (GLOVE) ×2 IMPLANT
GLOVE ORTHO TXT STRL SZ7.5 (GLOVE) ×1 IMPLANT
GLOVE SURG SYN 7.5 PF PI (GLOVE) IMPLANT
GOWN STRL REUS W/ TWL LRG LVL3 (GOWN DISPOSABLE) ×1 IMPLANT
GOWN STRL REUS W/ TWL XL LVL3 (GOWN DISPOSABLE) ×2 IMPLANT
NDL HYPO 25X1 1.5 SAFETY (NEEDLE) IMPLANT
NEEDLE HYPO 25X1 1.5 SAFETY (NEEDLE) ×1 IMPLANT
PACK BASIN DAY SURGERY FS (CUSTOM PROCEDURE TRAY) ×1 IMPLANT
PAD CAST 3X4 CTTN HI CHSV (CAST SUPPLIES) ×1 IMPLANT
SOLN 0.9% NACL POUR BTL 1000ML (IV SOLUTION) ×1 IMPLANT
SPLINT PLASTER CAST XFAST 3X15 (CAST SUPPLIES) IMPLANT
SUT ETHILON 3 0 PS 1 (SUTURE) IMPLANT
SUT ETHILON 4 0 PS 2 18 (SUTURE) IMPLANT
SUT MNCRL AB 3-0 PS2 18 (SUTURE) IMPLANT
SYR BULB EAR ULCER 3OZ GRN STR (SYRINGE) ×1 IMPLANT
SYR CONTROL 10ML LL (SYRINGE) IMPLANT
TOWEL GREEN STERILE FF (TOWEL DISPOSABLE) ×1 IMPLANT
UNDERPAD 30X36 HEAVY ABSORB (UNDERPADS AND DIAPERS) ×1 IMPLANT

## 2023-12-14 NOTE — Discharge Instructions (Addendum)
 Diet: As you were doing prior to hospitalization   Shower:  May shower but keep the wounds dry, use an occlusive plastic wrap, NO SOAKING IN TUB.  If the bandage gets wet, change with a clean dry gauze.  If you have a splint on, leave the splint in place and keep the splint dry with a plastic bag.  Dressing:  You may change your dressing 3-5 days after surgery, unless you have a splint.  If you have a splint, then just leave the splint in place and we will change your bandages during your first follow-up appointment.    If you had hand or foot surgery, we will plan to remove your stitches in about 2 weeks in the office.  For all other surgeries, there are sticky tapes (steri-strips) on your wounds and all the stitches are absorbable.  Leave the steri-strips in place when changing your dressings, they will peel off with time, usually 2-3 weeks.  Activity:  Increase activity slowly as tolerated, but follow the weight bearing instructions below.  The rules on driving is that you can not be taking narcotics while you drive, and you must feel in control of the vehicle.    Weight Bearing:   No bearing weight with left arm until follow up visit   To prevent constipation: you may use a stool softener such as -  Colace (over the counter) 100 mg by mouth twice a day  Drink plenty of fluids (prune juice may be helpful) and high fiber foods Miralax (over the counter) for constipation as needed.    Itching:  If you experience itching with your medications, try taking only a single pain pill, or even half a pain pill at a time.  You may take up to 10 pain pills per day, and you can also use benadryl over the counter for itching or also to help with sleep.   Precautions:  If you experience chest pain or shortness of breath - call 911 immediately for transfer to the hospital emergency department!!  If you develop a fever greater that 101 F, purulent drainage from wound, increased redness or drainage from  wound, or calf pain -- Call the office at [phone]                                                Follow- Up Appointment:  Please call for an appointment to be seen in 2 weeks [place] - [phone]     Post Anesthesia Home Care Instructions  Activity: Get plenty of rest for the remainder of the day. A responsible individual must stay with you for 24 hours following the procedure.  For the next 24 hours, DO NOT: -Drive a car -Advertising copywriter -Drink alcoholic beverages -Take any medication unless instructed by your physician -Make any legal decisions or sign important papers.  Meals: Start with liquid foods such as gelatin or soup. Progress to regular foods as tolerated. Avoid greasy, spicy, heavy foods. If nausea and/or vomiting occur, drink only clear liquids until the nausea and/or vomiting subsides. Call your physician if vomiting continues.  Special Instructions/Symptoms: Your throat may feel dry or sore from the anesthesia or the breathing tube placed in your throat during surgery. If this causes discomfort, gargle with warm salt water. The discomfort should disappear within 24 hours.  May have tylenol again after 130 and  ibuprofen after 330pm

## 2023-12-14 NOTE — Transfer of Care (Signed)
 Immediate Anesthesia Transfer of Care Note  Patient: Melissa Cantu  Procedure(s) Performed: EXCISION, GANGLION CYST, WRIST (Left: Wrist)  Patient Location: PACU  Anesthesia Type:General  Level of Consciousness: awake, drowsy, and patient cooperative  Airway & Oxygen Therapy: Patient Spontanous Breathing and Patient connected to face mask oxygen  Post-op Assessment: Report given to RN and Post -op Vital signs reviewed and stable  Post vital signs: Reviewed and stable  Last Vitals:  Vitals Value Taken Time  BP 103/65 12/14/23 09:38  Temp    Pulse 64 12/14/23 09:42  Resp 17 12/14/23 09:42  SpO2 100 % 12/14/23 09:42  Vitals shown include unfiled device data.  Last Pain:  Vitals:   12/14/23 0716  TempSrc: Temporal  PainSc: 5          Complications: No notable events documented.

## 2023-12-14 NOTE — Anesthesia Postprocedure Evaluation (Signed)
 Anesthesia Post Note  Patient: Hotel Manager  Procedure(s) Performed: EXCISION, GANGLION CYST, WRIST (Left: Wrist)     Patient location during evaluation: PACU Anesthesia Type: General Level of consciousness: awake and alert Pain management: pain level controlled Vital Signs Assessment: post-procedure vital signs reviewed and stable Respiratory status: spontaneous breathing, nonlabored ventilation, respiratory function stable and patient connected to nasal cannula oxygen Cardiovascular status: blood pressure returned to baseline and stable Postop Assessment: no apparent nausea or vomiting Anesthetic complications: no   No notable events documented.  Last Vitals:  Vitals:   12/14/23 1030 12/14/23 1045  BP: 96/76 108/71  Pulse: 70   Resp: 13 14  Temp:  36.7 C  SpO2: 100% 100%    Last Pain:  Vitals:   12/14/23 1101  TempSrc:   PainSc: 8                  Rome Ade

## 2023-12-14 NOTE — Op Note (Signed)
 12/14/2023  5:32 PM  PATIENT:  Bishop Urda    PRE-OPERATIVE DIAGNOSIS:  Ganglion of left wrist  POST-OPERATIVE DIAGNOSIS:  Same  PROCEDURE:  EXCISION, GANGLION CYST, WRIST, dorsal, dorsal  SURGEON:  Fonda SHAUNNA Olmsted, MD  PHYSICIAN ASSISTANT: Army Daring, PA-C, present and scrubbed throughout the case, critical for completion in a timely fashion, and for retraction, instrumentation, and closure.  ANESTHESIA:   General  PREOPERATIVE INDICATIONS:  Lasheena Frieze is a  33 y.o. female with a diagnosis of Ganglion of left wrist who failed conservative measures and elected for surgical management.    The risks benefits and alternatives were discussed with the patient preoperatively including but not limited to the risks of infection, bleeding, nerve injury, cardiopulmonary complications, the need for revision surgery, among others, and the patient was willing to proceed.  ESTIMATED BLOOD LOSS: Minimal  OPERATIVE IMPLANTS:   * No implants in log *  OPERATIVE FINDINGS: Sessile ganglion that involved a large portion of the dorsal capsule of the wrist.  OPERATIVE PROCEDURE: The patient was brought to the operating room and placed in the supine position.  General anesthesia was administered.  The left upper extremity was prepped and draped in usual sterile fashion.  Timeout procedure was performed.  The arm was elevated and exsanguinated and a tourniquet was inflated to 200 and millimeters of mercury.  Transverse incision was made over the dorsal ganglion and dissection carried down, the extensor tendons were retracted and protected throughout the case.  I excised the dorsal ganglion, although there was not a very clear stalk, it was really more of a extension from the dorsal capsule, I assessed the stability of the intercarpal ligaments at the completion of the case and they felt stable.  I did however have to excise a portion of the dorsal capsule with the ganglion excision because it was  really the floor of the ganglion.  I irrigated the wound copiously and repaired the subcutaneous tissue with Vicryl followed by Steri-Strips for the skin.  The wounds were injected.  She was awakened and returned to the PACU in stable satisfactory condition.  There were no complications and she tolerated the procedure well.  She was placed in a volar splint.

## 2023-12-14 NOTE — Anesthesia Preprocedure Evaluation (Signed)
 Anesthesia Evaluation  Patient identified by MRN, date of birth, ID band Patient awake    Reviewed: Allergy & Precautions, NPO status , Patient's Chart, lab work & pertinent test results  History of Anesthesia Complications Negative for: history of anesthetic complications  Airway Mallampati: II  TM Distance: >3 FB Neck ROM: Full    Dental no notable dental hx. (+) Teeth Intact   Pulmonary asthma , neg sleep apnea, neg COPD, Patient abstained from smoking.Not current smoker, former smoker vapes   Pulmonary exam normal breath sounds clear to auscultation       Cardiovascular Exercise Tolerance: Good METS(-) hypertension(-) CAD and (-) Past MI negative cardio ROS (-) dysrhythmias  Rhythm:Regular Rate:Normal - Systolic murmurs    Neuro/Psych  Headaches  negative psych ROS   GI/Hepatic ,neg GERD  ,,(+)     (-) substance abuse    Endo/Other  neg diabetes    Renal/GU negative Renal ROS     Musculoskeletal   Abdominal   Peds  Hematology   Anesthesia Other Findings Past Medical History: 2011 HR HPV, 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 : Abnormal Pap smear No date: ADHD (attention deficit hyperactivity disorder) No date: Anemia No date: Asthma No date: Chronic headaches No date: Hemorrhoids No date: MSH2-related Lynch syndrome (HNPCC1) No date: UTI (urinary tract infection)  Reproductive/Obstetrics                              Anesthesia Physical Anesthesia Plan  ASA: 2  Anesthesia Plan: General   Post-op Pain Management: Tylenol PO (pre-op)* and Toradol IV (intra-op)*   Induction: Intravenous  PONV Risk Score and Plan: 3 and Ondansetron , Dexamethasone  and Midazolam  Airway Management Planned: LMA  Additional Equipment: None  Intra-op Plan:   Post-operative Plan: Extubation in OR  Informed Consent: I have reviewed the patients History and  Physical, chart, labs and discussed the procedure including the risks, benefits and alternatives for the proposed anesthesia with the patient or authorized representative who has indicated his/her understanding and acceptance.     Dental advisory given  Plan Discussed with: CRNA and Surgeon  Anesthesia Plan Comments: (Discussed risks of anesthesia with patient, including PONV, sore throat, lip/dental/eye damage. Rare risks discussed as well, such as cardiorespiratory and neurological sequelae, and allergic reactions. Discussed the role of CRNA in patient's perioperative care. Patient understands.)         Anesthesia Quick Evaluation

## 2023-12-14 NOTE — Anesthesia Procedure Notes (Signed)
 Procedure Name: LMA Insertion Date/Time: 12/14/2023 8:48 AM  Performed by: Burnard Rosaline HERO, CRNAPre-anesthesia Checklist: Patient identified, Emergency Drugs available, Suction available and Patient being monitored Patient Re-evaluated:Patient Re-evaluated prior to induction Oxygen Delivery Method: Circle system utilized Preoxygenation: Pre-oxygenation with 100% oxygen Induction Type: IV induction Ventilation: Mask ventilation without difficulty LMA: LMA inserted LMA Size: 3.0 Number of attempts: 1 Airway Equipment and Method: Bite block Placement Confirmation: positive ETCO2, CO2 detector and breath sounds checked- equal and bilateral Tube secured with: Tape Dental Injury: Teeth and Oropharynx as per pre-operative assessment

## 2023-12-14 NOTE — Interval H&P Note (Signed)
 History and Physical Interval Note:  12/14/2023 7:14 AM  Melissa Cantu  has presented today for surgery, with the diagnosis of Ganglion of left wrist.  The various methods of treatment have been discussed with the patient and family. After consideration of risks, benefits and other options for treatment, the patient has consented to  Procedure(s): EXCISION, GANGLION CYST, WRIST (Left) as a surgical intervention.  The patient's history has been reviewed, patient examined, no change in status, stable for surgery.  I have reviewed the patient's chart and labs.  Questions were answered to the patient's satisfaction.     Fonda SHAUNNA Olmsted

## 2023-12-15 ENCOUNTER — Encounter (HOSPITAL_BASED_OUTPATIENT_CLINIC_OR_DEPARTMENT_OTHER): Payer: Self-pay | Admitting: Orthopedic Surgery

## 2023-12-16 LAB — SURGICAL PATHOLOGY
